# Patient Record
Sex: Male | Born: 1955 | Race: White | Hispanic: No | Marital: Single | Smoking: Current every day smoker
Health system: Southern US, Community
[De-identification: ages and names within clinical notes are randomized; demographics above are authoritative.]

## PROBLEM LIST (undated history)

## (undated) DIAGNOSIS — Z8673 Personal history of transient ischemic attack (TIA), and cerebral infarction without residual deficits: Secondary | ICD-10-CM

## (undated) DIAGNOSIS — Z87898 Personal history of other specified conditions: Secondary | ICD-10-CM

## (undated) DIAGNOSIS — K5790 Diverticulosis of intestine, part unspecified, without perforation or abscess without bleeding: Secondary | ICD-10-CM

## (undated) DIAGNOSIS — D369 Benign neoplasm, unspecified site: Secondary | ICD-10-CM

## (undated) DIAGNOSIS — F419 Anxiety disorder, unspecified: Secondary | ICD-10-CM

## (undated) DIAGNOSIS — F32A Depression, unspecified: Secondary | ICD-10-CM

## (undated) DIAGNOSIS — I10 Essential (primary) hypertension: Secondary | ICD-10-CM

## (undated) DIAGNOSIS — D696 Thrombocytopenia, unspecified: Secondary | ICD-10-CM

## (undated) DIAGNOSIS — K649 Unspecified hemorrhoids: Secondary | ICD-10-CM

## (undated) DIAGNOSIS — K449 Diaphragmatic hernia without obstruction or gangrene: Secondary | ICD-10-CM

## (undated) DIAGNOSIS — F1010 Alcohol abuse, uncomplicated: Secondary | ICD-10-CM

## (undated) DIAGNOSIS — F329 Major depressive disorder, single episode, unspecified: Secondary | ICD-10-CM

## (undated) HISTORY — DX: Thrombocytopenia, unspecified: D69.6

## (undated) HISTORY — DX: Unspecified hemorrhoids: K64.9

## (undated) HISTORY — DX: Personal history of other specified conditions: Z87.898

## (undated) HISTORY — DX: Diverticulosis of intestine, part unspecified, without perforation or abscess without bleeding: K57.90

## (undated) HISTORY — DX: Alcohol abuse, uncomplicated: F10.10

## (undated) HISTORY — DX: Diaphragmatic hernia without obstruction or gangrene: K44.9

## (undated) HISTORY — DX: Anxiety disorder, unspecified: F41.9

## (undated) HISTORY — DX: Personal history of transient ischemic attack (TIA), and cerebral infarction without residual deficits: Z86.73

## (undated) HISTORY — DX: Benign neoplasm, unspecified site: D36.9

## (undated) HISTORY — DX: Essential (primary) hypertension: I10

---

## 2009-12-15 ENCOUNTER — Inpatient Hospital Stay (HOSPITAL_COMMUNITY): Admission: EM | Admit: 2009-12-15 | Discharge: 2009-12-16 | Payer: Self-pay | Admitting: Emergency Medicine

## 2009-12-15 ENCOUNTER — Ambulatory Visit: Payer: Self-pay | Admitting: Cardiology

## 2009-12-16 ENCOUNTER — Encounter (INDEPENDENT_AMBULATORY_CARE_PROVIDER_SITE_OTHER): Payer: Self-pay | Admitting: Internal Medicine

## 2010-08-19 LAB — COMPREHENSIVE METABOLIC PANEL
ALT: 48 U/L (ref 0–53)
AST: 42 U/L — ABNORMAL HIGH (ref 0–37)
Albumin: 3.5 g/dL (ref 3.5–5.2)
CO2: 24 mEq/L (ref 19–32)
Calcium: 8.2 mg/dL — ABNORMAL LOW (ref 8.4–10.5)
Chloride: 111 mEq/L (ref 96–112)
Sodium: 141 mEq/L (ref 135–145)
Total Bilirubin: 0.4 mg/dL (ref 0.3–1.2)

## 2010-08-19 LAB — CBC
Hemoglobin: 13.5 g/dL (ref 13.0–17.0)
MCHC: 34.9 g/dL (ref 30.0–36.0)
MCV: 92.7 fL (ref 78.0–100.0)
RBC: 4.16 MIL/uL — ABNORMAL LOW (ref 4.22–5.81)
RDW: 12.9 % (ref 11.5–15.5)
WBC: 5.3 10*3/uL (ref 4.0–10.5)

## 2010-08-19 LAB — CARDIAC PANEL(CRET KIN+CKTOT+MB+TROPI)
CK, MB: 2.2 ng/mL (ref 0.3–4.0)
Relative Index: 1.5 (ref 0.0–2.5)
Total CK: 158 U/L (ref 7–232)
Total CK: 200 U/L (ref 7–232)
Troponin I: 0.02 ng/mL (ref 0.00–0.06)
Troponin I: 0.03 ng/mL (ref 0.00–0.06)

## 2010-08-19 LAB — DIFFERENTIAL
Basophils Relative: 1 % (ref 0–1)
Eosinophils Relative: 3 % (ref 0–5)
Lymphocytes Relative: 43 % (ref 12–46)
Monocytes Absolute: 0.5 10*3/uL (ref 0.1–1.0)
Neutro Abs: 2.3 10*3/uL (ref 1.7–7.7)
Neutrophils Relative %: 44 % (ref 43–77)

## 2010-08-19 LAB — LIPID PANEL
Cholesterol: 108 mg/dL (ref 0–200)
Total CHOL/HDL Ratio: 4.7 RATIO
Triglycerides: 190 mg/dL — ABNORMAL HIGH (ref <150)
VLDL: 38 mg/dL (ref 0–40)

## 2010-08-19 LAB — TSH: TSH: 4.069 u[IU]/mL (ref 0.350–4.500)

## 2010-08-20 LAB — COMPREHENSIVE METABOLIC PANEL
ALT: 58 U/L — ABNORMAL HIGH (ref 0–53)
AST: 55 U/L — ABNORMAL HIGH (ref 0–37)
Albumin: 4.2 g/dL (ref 3.5–5.2)
Alkaline Phosphatase: 66 U/L (ref 39–117)
BUN: 7 mg/dL (ref 6–23)
CO2: 23 mEq/L (ref 19–32)
Calcium: 8.5 mg/dL (ref 8.4–10.5)
Chloride: 113 mEq/L — ABNORMAL HIGH (ref 96–112)
Creatinine, Ser: 0.8 mg/dL (ref 0.4–1.5)
GFR calc Af Amer: >60 mL/min (ref >60)
GFR calc non Af Amer: >60 mL/min (ref >60)
Glucose, Bld: 116 mg/dL — ABNORMAL HIGH (ref 70–99)
Potassium: 4.1 mEq/L (ref 3.5–5.1)
Sodium: 144 mEq/L (ref 135–145)
Total Bilirubin: 0.5 mg/dL (ref 0.3–1.2)
Total Protein: 8.2 g/dL (ref 6.0–8.3)

## 2010-08-20 LAB — CBC
HCT: 44.6 % (ref 39.0–52.0)
Hemoglobin: 15.8 g/dL (ref 13.0–17.0)
MCH: 32.4 pg (ref 26.0–34.0)
MCHC: 35.3 g/dL (ref 30.0–36.0)
MCV: 91.8 fL (ref 78.0–100.0)
Platelets: 122 10*3/uL — ABNORMAL LOW (ref 150–400)
RBC: 4.86 MIL/uL (ref 4.22–5.81)
RDW: 12.9 % (ref 11.5–15.5)
WBC: 5.8 10*3/uL (ref 4.0–10.5)

## 2010-08-20 LAB — POCT CARDIAC MARKERS
CKMB, poc: 1.9 ng/mL (ref 1.0–8.0)
CKMB, poc: 2.4 ng/mL (ref 1.0–8.0)
CKMB, poc: 2.5 ng/mL (ref 1.0–8.0)
Myoglobin, poc: 209 ng/mL (ref 12–200)
Myoglobin, poc: 238 ng/mL (ref 12–200)
Troponin i, poc: <0.05 ng/mL (ref 0.00–0.09)
Troponin i, poc: <0.05 ng/mL (ref 0.00–0.09)

## 2010-08-20 LAB — PROTIME-INR
INR: 1.1 (ref 0.00–1.49)
Prothrombin Time: 14.1 seconds (ref 11.6–15.2)

## 2010-08-20 LAB — DIFFERENTIAL
Basophils Absolute: 0 10*3/uL (ref 0.0–0.1)
Basophils Relative: 1 % (ref 0–1)
Eosinophils Absolute: 0.1 10*3/uL (ref 0.0–0.7)
Eosinophils Relative: 2 % (ref 0–5)
Lymphocytes Relative: 38 % (ref 12–46)
Lymphs Abs: 2.2 10*3/uL (ref 0.7–4.0)
Monocytes Absolute: 0.4 10*3/uL (ref 0.1–1.0)
Monocytes Relative: 7 % (ref 3–12)
Neutro Abs: 3 10*3/uL (ref 1.7–7.7)
Neutrophils Relative %: 52 % (ref 43–77)

## 2010-08-20 LAB — ETHANOL: Alcohol, Ethyl (B): 284 mg/dL — ABNORMAL HIGH (ref 0–10)

## 2011-06-28 ENCOUNTER — Encounter: Payer: Self-pay | Admitting: Cardiology

## 2011-06-28 DIAGNOSIS — R079 Chest pain, unspecified: Secondary | ICD-10-CM | POA: Insufficient documentation

## 2011-06-28 DIAGNOSIS — F1010 Alcohol abuse, uncomplicated: Secondary | ICD-10-CM | POA: Insufficient documentation

## 2011-06-28 DIAGNOSIS — I10 Essential (primary) hypertension: Secondary | ICD-10-CM | POA: Insufficient documentation

## 2011-06-28 DIAGNOSIS — Z720 Tobacco use: Secondary | ICD-10-CM | POA: Insufficient documentation

## 2011-06-28 DIAGNOSIS — D696 Thrombocytopenia, unspecified: Secondary | ICD-10-CM | POA: Insufficient documentation

## 2011-07-20 ENCOUNTER — Encounter: Payer: Self-pay | Admitting: Cardiology

## 2011-07-27 ENCOUNTER — Telehealth: Payer: Self-pay | Admitting: Cardiology

## 2011-07-27 NOTE — Telephone Encounter (Signed)
PT WAS ON NOS LIST BUT HAS NEVER ESTABLISHED WITH THE OFFICE/TMJ

## 2011-08-20 ENCOUNTER — Other Ambulatory Visit: Payer: Self-pay

## 2011-08-20 ENCOUNTER — Emergency Department (HOSPITAL_COMMUNITY): Payer: Self-pay

## 2011-08-20 ENCOUNTER — Inpatient Hospital Stay (HOSPITAL_COMMUNITY)
Admission: EM | Admit: 2011-08-20 | Discharge: 2011-08-22 | DRG: 066 | Disposition: A | Discharge status: Home / Self Care | Payer: MEDICAID | Attending: Internal Medicine | Admitting: Internal Medicine

## 2011-08-20 ENCOUNTER — Encounter (HOSPITAL_COMMUNITY): Payer: Self-pay | Admitting: Emergency Medicine

## 2011-08-20 DIAGNOSIS — F32A Depression, unspecified: Secondary | ICD-10-CM

## 2011-08-20 DIAGNOSIS — I10 Essential (primary) hypertension: Secondary | ICD-10-CM

## 2011-08-20 DIAGNOSIS — I6350 Cerebral infarction due to unspecified occlusion or stenosis of unspecified cerebral artery: Principal | ICD-10-CM | POA: Diagnosis present

## 2011-08-20 DIAGNOSIS — F329 Major depressive disorder, single episode, unspecified: Secondary | ICD-10-CM | POA: Diagnosis present

## 2011-08-20 DIAGNOSIS — D696 Thrombocytopenia, unspecified: Secondary | ICD-10-CM

## 2011-08-20 DIAGNOSIS — F1010 Alcohol abuse, uncomplicated: Secondary | ICD-10-CM

## 2011-08-20 DIAGNOSIS — Z720 Tobacco use: Secondary | ICD-10-CM

## 2011-08-20 DIAGNOSIS — I639 Cerebral infarction, unspecified: Secondary | ICD-10-CM | POA: Diagnosis present

## 2011-08-20 DIAGNOSIS — F17200 Nicotine dependence, unspecified, uncomplicated: Secondary | ICD-10-CM | POA: Diagnosis present

## 2011-08-20 DIAGNOSIS — F3289 Other specified depressive episodes: Secondary | ICD-10-CM | POA: Diagnosis present

## 2011-08-20 DIAGNOSIS — F339 Major depressive disorder, recurrent, unspecified: Secondary | ICD-10-CM | POA: Diagnosis present

## 2011-08-20 DIAGNOSIS — R079 Chest pain, unspecified: Secondary | ICD-10-CM | POA: Diagnosis present

## 2011-08-20 LAB — URINALYSIS, ROUTINE W REFLEX MICROSCOPIC
Glucose, UA: NEGATIVE mg/dL
Leukocytes, UA: NEGATIVE
Specific Gravity, Urine: <1.005 — ABNORMAL LOW (ref 1.005–1.030)
pH: 5.5 (ref 5.0–8.0)

## 2011-08-20 LAB — COMPREHENSIVE METABOLIC PANEL
ALT: 109 U/L — ABNORMAL HIGH (ref 0–53)
AST: 97 U/L — ABNORMAL HIGH (ref 0–37)
Alkaline Phosphatase: 84 U/L (ref 39–117)
CO2: 25 mEq/L (ref 19–32)
Chloride: 101 mEq/L (ref 96–112)
Creatinine, Ser: 0.7 mg/dL (ref 0.50–1.35)
GFR calc non Af Amer: >90 mL/min (ref >90)
Potassium: 3.7 mEq/L (ref 3.5–5.1)
Sodium: 138 mEq/L (ref 135–145)
Total Bilirubin: 0.7 mg/dL (ref 0.3–1.2)

## 2011-08-20 LAB — URINE MICROSCOPIC-ADD ON

## 2011-08-20 LAB — CARDIAC PANEL(CRET KIN+CKTOT+MB+TROPI)
CK, MB: 1.4 ng/mL (ref 0.3–4.0)
Total CK: 55 U/L (ref 7–232)

## 2011-08-20 LAB — CBC
HCT: 46.2 % (ref 39.0–52.0)
RDW: 13.6 % (ref 11.5–15.5)
WBC: 7.6 10*3/uL (ref 4.0–10.5)

## 2011-08-20 LAB — DIFFERENTIAL
Basophils Absolute: 0 10*3/uL (ref 0.0–0.1)
Lymphocytes Relative: 15 % (ref 12–46)
Monocytes Absolute: 0.6 10*3/uL (ref 0.1–1.0)
Neutro Abs: 5.9 10*3/uL (ref 1.7–7.7)

## 2011-08-20 MED ORDER — ALPRAZOLAM 0.25 MG PO TABS
0.2500 mg | ORAL_TABLET | Freq: Three times a day (TID) | ORAL | Status: DC
  Administered 2011-08-20 – 2011-08-22 (×5): 0.25 mg via ORAL
  Filled 2011-08-20 (×5): qty 1

## 2011-08-20 MED ORDER — ACETAMINOPHEN 650 MG RE SUPP: 650.0000 mg | RECTAL | Status: DC | PRN

## 2011-08-20 MED ORDER — CITALOPRAM HYDROBROMIDE 20 MG PO TABS
10.0000 mg | ORAL_TABLET | Freq: Every day | ORAL | Status: DC
  Administered 2011-08-21 – 2011-08-22 (×2): 10 mg via ORAL
  Filled 2011-08-20 (×2): qty 1

## 2011-08-20 MED ORDER — ACETAMINOPHEN 325 MG PO TABS: 650.0000 mg | ORAL_TABLET | ORAL | Status: DC | PRN

## 2011-08-20 MED ORDER — ATENOLOL 25 MG PO TABS
25.0000 mg | ORAL_TABLET | Freq: Every day | ORAL | Status: DC
  Administered 2011-08-20 – 2011-08-22 (×3): 25 mg via ORAL
  Filled 2011-08-20 (×3): qty 1

## 2011-08-20 MED ORDER — SERTRALINE HCL 50 MG PO TABS: 50.0000 mg | ORAL_TABLET | Freq: Every day | ORAL | Status: DC

## 2011-08-20 MED ORDER — AMLODIPINE BESYLATE 5 MG PO TABS
10.0000 mg | ORAL_TABLET | Freq: Every day | ORAL | Status: DC
  Administered 2011-08-21 – 2011-08-22 (×2): 10 mg via ORAL
  Filled 2011-08-20 (×2): qty 2

## 2011-08-20 MED ORDER — ONDANSETRON HCL 4 MG/2ML IJ SOLN: 4.0000 mg | Freq: Four times a day (QID) | INTRAMUSCULAR | Status: DC | PRN

## 2011-08-20 MED ORDER — ENOXAPARIN SODIUM 40 MG/0.4ML ~~LOC~~ SOLN
40.0000 mg | SUBCUTANEOUS | Status: DC
  Administered 2011-08-20 – 2011-08-21 (×2): 40 mg via SUBCUTANEOUS
  Filled 2011-08-20 (×2): qty 0.4

## 2011-08-20 MED ORDER — ONDANSETRON HCL 4 MG/2ML IJ SOLN: 4.0000 mg | Freq: Three times a day (TID) | INTRAMUSCULAR | Status: DC | PRN

## 2011-08-20 MED ORDER — NICOTINE 7 MG/24HR TD PT24
7.0000 mg | MEDICATED_PATCH | Freq: Every day | TRANSDERMAL | Status: DC
  Administered 2011-08-20 – 2011-08-22 (×3): 7 mg via TRANSDERMAL
  Filled 2011-08-20 (×5): qty 1

## 2011-08-20 MED ORDER — ASPIRIN 300 MG RE SUPP
300.0000 mg | Freq: Every day | RECTAL | Status: DC
  Filled 2011-08-20 (×3): qty 1

## 2011-08-20 MED ORDER — NICOTINE 7 MG/24HR TD PT24
MEDICATED_PATCH | TRANSDERMAL | Status: AC
  Filled 2011-08-20: qty 1

## 2011-08-20 MED ORDER — ASPIRIN 325 MG PO TABS
325.0000 mg | ORAL_TABLET | Freq: Every day | ORAL | Status: DC
  Administered 2011-08-20 – 2011-08-22 (×3): 325 mg via ORAL
  Filled 2011-08-20 (×3): qty 1

## 2011-08-20 MED ORDER — SENNOSIDES-DOCUSATE SODIUM 8.6-50 MG PO TABS: 1.0000 | ORAL_TABLET | Freq: Every evening | ORAL | Status: DC | PRN

## 2011-08-20 NOTE — H&P (Signed)
PCP:   Dwana Melena, MD, MD   Chief Complaint:  Right arm numbness  HPI: This is a 55 year old gentleman with past medical history of hypertension, tobacco abuse, depression, alcohol abuse. He reports the emergency room today when he woke up at around 2 AM this morning with complaints of right arm numbness. His symptoms progressed to involve his right leg, right side of his neck and face. He denies any weakness in the area, no changes in his vision, no difficulty with speech or swallowing. He has had intermittent chest pain for quite some time now. Not related specifically to exertion, often occurring at rest. He says he was supposed to be evaluated by cardiology as an outpatient but did not show for his outpatient appointment. Denies any fever, cough, nausea, vomiting, abdominal complaints. He still actively smoking. He reports drinking approximately twice a week, but admits to drinking heavily. He reports that in the past when he has stopped drinking he has had tremors. His last drink was 2 days ago. Patient was evaluated in the emergency room where MRI of the brain showed an acute infarct. He has been referred for admission.  Allergies:  Not on File    Past Medical History  Diagnosis Date  . Chest pain 2007    Associated with syncope in 2011  . Hypertension     Echo in 2011-mild LVH, hyperdynamic LV function, mild to moderate AI  . Alcohol abuse   . Tobacco abuse     20 pack years; 1/2 pack per day  . Thrombocytopenia     possible alcohol toxicity; 122,000  . Chronic sinusitis     By CT    History reviewed. No pertinent past surgical history.  Prior to Admission medications   Medication Sig Start Date End Date Taking? Authorizing Provider  ALPRAZolam (XANAX) 0.25 MG tablet Take 0.25 mg by mouth every 8 (eight) hours.   Yes Historical Provider, MD  amLODipine (NORVASC) 10 MG tablet Take 10 mg by mouth daily.   Yes Historical Provider, MD  atenolol (TENORMIN) 25 MG tablet Take 25 mg  by mouth daily.   Yes Historical Provider, MD  citalopram (CELEXA) 10 MG tablet Take 10 mg by mouth daily.   Yes Historical Provider, MD  sertraline (ZOLOFT) 50 MG tablet Take 50 mg by mouth daily. Prescription never filled   Yes Historical Provider, MD    Social History:  reports that he has been smoking.  He has never used smokeless tobacco. He reports that he drinks about 15 ounces of alcohol per week. He reports that he does not use illicit drugs.  Family History  Problem Relation Age of Onset  . Hypertension    . Alcohol abuse Neg Hx   . Coronary artery disease Mother 42    CABG  . Coronary artery disease Father 70    CABG    Review of Systems: Positives in bold Constitutional: Denies fever, chills, diaphoresis, appetite change and fatigue.  HEENT: Denies photophobia, eye pain, redness, hearing loss, ear pain, congestion, sore throat, rhinorrhea, sneezing, mouth sores, trouble swallowing, neck pain, neck stiffness and tinnitus.   Respiratory: Denies SOB, DOE, cough, chest tightness,  and wheezing.   Cardiovascular: Denies chest pain, palpitations and leg swelling.  Gastrointestinal: Denies nausea, vomiting, abdominal pain, diarrhea, constipation, blood in stool (hemorrhoidal, chronic) and abdominal distention.  Genitourinary: Denies dysuria, urgency, frequency, hematuria, flank pain and difficulty urinating.  Musculoskeletal: Denies myalgias, back pain, joint swelling, arthralgias and gait problem.  Skin: Denies pallor,  rash and wound.  Neurological: Denies dizziness, seizures, syncope, weakness, light-headedness, numbness and headaches.  Hematological: Denies adenopathy. Easy bruising, personal or family bleeding history  Psychiatric/Behavioral: Denies suicidal ideation, mood changes, confusion, nervousness, sleep disturbance and agitation   Physical Exam: Blood pressure 173/92, pulse 73, temperature 98.4 F (36.9 C), temperature source Oral, resp. rate 18, height 5\' 7"  (1.702  m), weight 70.308 kg (155 lb), SpO2 95.00%. General: Patient is lying in bed, in no acute distress HEENT: Normocephalic, atraumatic, extraocular motions are intact Neck: Supple Chest: Clear to auscultation bilaterally Cardiac: S1, S2, regular rate and rhythm Abdomen: Soft, nontender, nondistended, bowel sounds are active Extremities: No cyanosis, clubbing, edema Neurologic: 5 out of 5 strength bilaterally in the upper or lower extremities, cranial nerves appear to be intact Skin: Intact, no visible rashes  Labs on Admission:  Results for orders placed during the hospital encounter of 08/20/11 (from the past 48 hour(s))  CBC     Status: Abnormal   Collection Time   08/20/11 12:49 PM      Component Value Range Comment   WBC 7.6  4.0 - 10.5 (K/uL)    RBC 5.05  4.22 - 5.81 (MIL/uL)    Hemoglobin 16.4  13.0 - 17.0 (g/dL)    HCT 16.1  09.6 - 04.5 (%)    MCV 91.5  78.0 - 100.0 (fL)    MCH 32.5  26.0 - 34.0 (pg)    MCHC 35.5  30.0 - 36.0 (g/dL)    RDW 40.9  81.1 - 91.4 (%)    Platelets 140 (*) 150 - 400 (K/uL)   DIFFERENTIAL     Status: Normal   Collection Time   08/20/11 12:49 PM      Component Value Range Comment   Neutrophils Relative 77  43 - 77 (%)    Neutro Abs 5.9  1.7 - 7.7 (K/uL)    Lymphocytes Relative 15  12 - 46 (%)    Lymphs Abs 1.1  0.7 - 4.0 (K/uL)    Monocytes Relative 8  3 - 12 (%)    Monocytes Absolute 0.6  0.1 - 1.0 (K/uL)    Eosinophils Relative 0  0 - 5 (%)    Eosinophils Absolute 0.0  0.0 - 0.7 (K/uL)    Basophils Relative 1  0 - 1 (%)    Basophils Absolute 0.0  0.0 - 0.1 (K/uL)   COMPREHENSIVE METABOLIC PANEL     Status: Abnormal   Collection Time   08/20/11 12:49 PM      Component Value Range Comment   Sodium 138  135 - 145 (mEq/L)    Potassium 3.7  3.5 - 5.1 (mEq/L)    Chloride 101  96 - 112 (mEq/L)    CO2 25  19 - 32 (mEq/L)    Glucose, Bld 115 (*) 70 - 99 (mg/dL)    BUN 11  6 - 23 (mg/dL)    Creatinine, Ser 7.82  0.50 - 1.35 (mg/dL)    Calcium 9.3   8.4 - 10.5 (mg/dL)    Total Protein 8.2  6.0 - 8.3 (g/dL)    Albumin 4.0  3.5 - 5.2 (g/dL)    AST 97 (*) 0 - 37 (U/L)    ALT 109 (*) 0 - 53 (U/L)    Alkaline Phosphatase 84  39 - 117 (U/L)    Total Bilirubin 0.7  0.3 - 1.2 (mg/dL)    GFR calc non Af Amer >90  >90 (mL/min)    GFR  calc Af Amer >90  >90 (mL/min)   CARDIAC PANEL(CRET KIN+CKTOT+MB+TROPI)     Status: Normal   Collection Time   08/20/11 12:49 PM      Component Value Range Comment   Total CK 59  7 - 232 (U/L)    CK, MB 1.4  0.3 - 4.0 (ng/mL)    Troponin I <0.30  <0.30 (ng/mL)    Relative Index RELATIVE INDEX IS INVALID  0.0 - 2.5    URINALYSIS, ROUTINE W REFLEX MICROSCOPIC     Status: Abnormal   Collection Time   08/20/11  1:48 PM      Component Value Range Comment   Color, Urine YELLOW  YELLOW     APPearance CLEAR  CLEAR     Specific Gravity, Urine <1.005 (*) 1.005 - 1.030     pH 5.5  5.0 - 8.0     Glucose, UA NEGATIVE  NEGATIVE (mg/dL)    Hgb urine dipstick MODERATE (*) NEGATIVE     Bilirubin Urine NEGATIVE  NEGATIVE     Ketones, ur NEGATIVE  NEGATIVE (mg/dL)    Protein, ur TRACE (*) NEGATIVE (mg/dL)    Urobilinogen, UA 0.2  0.0 - 1.0 (mg/dL)    Nitrite NEGATIVE  NEGATIVE     Leukocytes, UA NEGATIVE  NEGATIVE    URINE MICROSCOPIC-ADD ON     Status: Abnormal   Collection Time   08/20/11  1:48 PM      Component Value Range Comment   Squamous Epithelial / LPF FEW (*) RARE     WBC, UA 0-2  <3 (WBC/hpf)    RBC / HPF 3-6  <3 (RBC/hpf)    Bacteria, UA RARE  RARE     Casts HYALINE CASTS (*) NEGATIVE      Radiological Exams on Admission: Dg Chest 2 View  08/20/2011  *RADIOLOGY REPORT*  Clinical Data: Chest pain, right side numbness, history hypertension, smoking  CHEST - 2 VIEW  Comparison: 12/15/2009  Findings: Normal heart size, mediastinal contours, and pulmonary vascularity. Atherosclerotic calcification aortic arch. Chronic bronchitic changes. No pulmonary infiltrate, pleural effusion or pneumothorax. No acute  osseous findings.  IMPRESSION: Chronic bronchitic changes.  Original Report Authenticated By: Lollie Marrow, M.D.   Ct Head Wo Contrast  08/20/2011  *RADIOLOGY REPORT*  Clinical Data: Dizziness with standing.  Numbness to right side of face and arm.  Hypertension.  CT HEAD WITHOUT CONTRAST  Technique:  Contiguous axial images were obtained from the base of the skull through the vertex without contrast.  Comparison: 12/15/2009  Findings: Bone windows demonstrate clear paranasal sinuses and mastoid air cells.  Soft tissue windows demonstrate low density in the left thalamus is most consistent with remote lacunar infarct, but is new since 12/15/2009.  Image 15. No  mass lesion, hemorrhage, hydrocephalus, acute infarct, intra-axial, or extra-axial fluid collection.  IMPRESSION:  1.  No acute intracranial abnormality. 2.    Remote left thalamic caudate lacunar infarct.  New since 12/15/2009.  Original Report Authenticated By: Consuello Bossier, M.D.   Mr Brain Wo Contrast  08/20/2011  *RADIOLOGY REPORT*  Clinical Data: Dizziness.  Right-sided numbness.  MRI HEAD WITHOUT CONTRAST  Technique:  Multiplanar, multiecho pulse sequences of the brain and surrounding structures were obtained according to standard protocol without intravenous contrast.  Comparison: Head CT same day  Findings: Diffusion imaging shows a 3 mm acute infarction within the lower left dorsal pons, possibly with minor involvement of the medulla as well.  No evidence of swelling or  hemorrhage.  Elsewhere, the brain shows old lacunar infarctions in the left thalamus and mild chronic small vessel change within the hemispheric white matter.  No cortical or large vessel territory infarction.  No mass lesion, hemorrhage, hydrocephalus or extra- axial collection.  No pituitary mass.  No inflammatory sinus disease.  No skull or skull base lesion.  IMPRESSION: 3 mm focus of acute infarction within the left dorsal pons. Question minor involvement in the upper  medulla as well.  No evidence of swelling or hemorrhage.  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  Minor chronic small vessel change within the hemispheric white matter.  Original Report Authenticated By: Thomasenia Sales, M.D.    Assessment/Plan Principal Problem:  *Stroke Active Problems:  Chest pain  Hypertension  Alcohol abuse  Tobacco abuse  Depression  Plan:  Patient will be admitted to the hospital to telemetry bed for further workup of his stroke. He reports taking aspirin infrequently. We will continue him on aspirin for now and asked for neurology consultation. He will undergo 2-D echocardiogram and carotid Dopplers as well as MRA of the head. Further orders per stroke orders set  Patient has had intermittent chest pain, we will cycle his cardiac markers and obtain a 2-D echocardiogram. If all these are negative then he can likely follow up with cardiology as an outpatient for stress testing.  Patient was extensively counseled on the importance of tobacco cessation. We will provide him with a nicotine patch.  We will watch for any signs of alcohol withdrawal at this time.  We will continue his outpatient blood pressure medication and adjust accordingly  Further orders per the clinical course  Time Spent on Admission:  Tomoya Ringwald [facility] Pager: [phone] 08/20/2011, 6:23 PM

## 2011-08-20 NOTE — ED Notes (Signed)
Attempted to call report and was unsuccessful.

## 2011-08-20 NOTE — ED Notes (Signed)
Pt c/o rt sided numbness and tingling since 230am.

## 2011-08-20 NOTE — ED Provider Notes (Signed)
History   This chart was scribed for Dione Booze, MD by Clarita Crane. The patient was seen in room [facility]18[facility]. Patient's care was started at 1200.    CSN: [id number]  Arrival date & time 08/20/11  1200   First MD Initiated Contact with Patient 08/20/11 1223      Chief Complaint  Patient presents with  . Numbness    (Consider location/radiation/quality/duration/timing/severity/associated sxs/prior treatment) HPI Jesse Stafford is a 56 y.o. male who presents to the Emergency Department complaining of constant moderate to severe numbness to right side of face, right upper extremity and right lower extremity onset 10.5 hours ago and persistent since with associated dizziness with standing. Denies weakness, slurred speech, aphasia, chest pain, abdominal pain, nausea, vomiting, HA  and previous history of similar symptoms. Patient with h/o HTN, thrombocytopenia, chronic sinusitis and is right hand dominant.   PCP- Margo Aye  Past Medical History  Diagnosis Date  . Chest pain 2007    Associated with syncope in 2011  . Hypertension     Echo in 2011-mild LVH, hyperdynamic LV function, mild to moderate AI  . Alcohol abuse   . Tobacco abuse     20 pack years; 1/2 pack per day  . Thrombocytopenia     possible alcohol toxicity; 122,000  . Chronic sinusitis     By CT    History reviewed. No pertinent past surgical history.  Family History  Problem Relation Age of Onset  . Hypertension    . Alcohol abuse Neg Hx   . Coronary artery disease Mother 82    CABG  . Coronary artery disease Father 43    CABG    History  Substance Use Topics  . Smoking status: Current Everyday Smoker -- 1.0 packs/day for 40 years  . Smokeless tobacco: Never Used  . Alcohol Use: 15.0 oz/week    30 drink(s) per week     Multiple hospital admissions with chest pain and inebriation      Review of Systems  Constitutional: Negative for fever, chills and diaphoresis.  HENT: Negative for neck pain.     Respiratory: Negative for shortness of breath.   Cardiovascular: Negative for chest pain.  Gastrointestinal: Negative for nausea, vomiting, abdominal pain and diarrhea.  Neurological: Positive for dizziness and numbness.  All other systems reviewed and are negative.    Allergies  Review of patient's allergies indicates no known allergies.  Home Medications  No current outpatient prescriptions on file.  BP 173/92  Pulse 73  Temp(Src) 98.4 F (36.9 C) (Oral)  Resp 18  Ht 5\' 7"  (1.702 m)  Wt 155 lb (70.308 kg)  BMI 24.28 kg/m2  SpO2 95%  Physical Exam  Nursing note and vitals reviewed. Constitutional: He is oriented to person, place, and time. He appears well-developed and well-nourished. No distress.  HENT:  Head: Normocephalic and atraumatic.  Eyes: EOM are normal. Pupils are equal, round, and reactive to light.       Fundi normal bilaterally.   Neck: Neck supple. No tracheal deviation present.       No carotid bruit.   Cardiovascular: Normal rate and regular rhythm.  Exam reveals no gallop and no friction rub.   No murmur heard. Pulmonary/Chest: Effort normal. No respiratory distress. He has no wheezes.  Abdominal: Soft. Bowel sounds are normal. He exhibits no distension.  Musculoskeletal: Normal range of motion. He exhibits no edema.  Neurological: He is alert and oriented to person, place, and time. No cranial nerve deficit or  sensory deficit.       No pronator drift. Sensation intact and equal to bilateral sides of face, bilateral upper extremities and bilateral lower extremities. Mild weakness to right lower extremity, rated 4+/5.   Skin: Skin is warm and dry.  Psychiatric: He has a normal mood and affect. His behavior is normal.    ED Course  Procedures (including critical care time)  DIAGNOSTIC STUDIES: Oxygen Saturation is 95% on room air, adequate by my interpretation.    COORDINATION OF CARE: 12:36PM- Patient informed of current plan for treatment and  evaluation and agrees with plan at this time.     Results for orders placed during the hospital encounter of 08/20/11  CBC      Component Value Range   WBC 7.6  4.0 - 10.5 (K/uL)   RBC 5.05  4.22 - 5.81 (MIL/uL)   Hemoglobin 16.4  13.0 - 17.0 (g/dL)   HCT 40.9  81.1 - 91.4 (%)   MCV 91.5  78.0 - 100.0 (fL)   MCH 32.5  26.0 - 34.0 (pg)   MCHC 35.5  30.0 - 36.0 (g/dL)   RDW 78.2  95.6 - 21.3 (%)   Platelets 140 (*) 150 - 400 (K/uL)  DIFFERENTIAL      Component Value Range   Neutrophils Relative 77  43 - 77 (%)   Neutro Abs 5.9  1.7 - 7.7 (K/uL)   Lymphocytes Relative 15  12 - 46 (%)   Lymphs Abs 1.1  0.7 - 4.0 (K/uL)   Monocytes Relative 8  3 - 12 (%)   Monocytes Absolute 0.6  0.1 - 1.0 (K/uL)   Eosinophils Relative 0  0 - 5 (%)   Eosinophils Absolute 0.0  0.0 - 0.7 (K/uL)   Basophils Relative 1  0 - 1 (%)   Basophils Absolute 0.0  0.0 - 0.1 (K/uL)  COMPREHENSIVE METABOLIC PANEL      Component Value Range   Sodium 138  135 - 145 (mEq/L)   Potassium 3.7  3.5 - 5.1 (mEq/L)   Chloride 101  96 - 112 (mEq/L)   CO2 25  19 - 32 (mEq/L)   Glucose, Bld 115 (*) 70 - 99 (mg/dL)   BUN 11  6 - 23 (mg/dL)   Creatinine, Ser 0.86  0.50 - 1.35 (mg/dL)   Calcium 9.3  8.4 - 57.8 (mg/dL)   Total Protein 8.2  6.0 - 8.3 (g/dL)   Albumin 4.0  3.5 - 5.2 (g/dL)   AST 97 (*) 0 - 37 (U/L)   ALT 109 (*) 0 - 53 (U/L)   Alkaline Phosphatase 84  39 - 117 (U/L)   Total Bilirubin 0.7  0.3 - 1.2 (mg/dL)   GFR calc non Af Amer >90  >90 (mL/min)   GFR calc Af Amer >90  >90 (mL/min)  CARDIAC PANEL(CRET KIN+CKTOT+MB+TROPI)      Component Value Range   Total CK 59  7 - 232 (U/L)   CK, MB 1.4  0.3 - 4.0 (ng/mL)   Troponin I <0.30  <0.30 (ng/mL)   Relative Index RELATIVE INDEX IS INVALID  0.0 - 2.5   URINALYSIS, ROUTINE W REFLEX MICROSCOPIC      Component Value Range   Color, Urine YELLOW  YELLOW    APPearance CLEAR  CLEAR    Specific Gravity, Urine <1.005 (*) 1.005 - 1.030    pH 5.5  5.0 - 8.0     Glucose, UA NEGATIVE  NEGATIVE (mg/dL)   Hgb urine dipstick  MODERATE (*) NEGATIVE    Bilirubin Urine NEGATIVE  NEGATIVE    Ketones, ur NEGATIVE  NEGATIVE (mg/dL)   Protein, ur TRACE (*) NEGATIVE (mg/dL)   Urobilinogen, UA 0.2  0.0 - 1.0 (mg/dL)   Nitrite NEGATIVE  NEGATIVE    Leukocytes, UA NEGATIVE  NEGATIVE   URINE MICROSCOPIC-ADD ON      Component Value Range   Squamous Epithelial / LPF FEW (*) RARE    WBC, UA 0-2  <3 (WBC/hpf)   RBC / HPF 3-6  <3 (RBC/hpf)   Bacteria, UA RARE  RARE    Casts HYALINE CASTS (*) NEGATIVE    Dg Chest 2 View  08/20/2011  *RADIOLOGY REPORT*  Clinical Data: Chest pain, right side numbness, history hypertension, smoking  CHEST - 2 VIEW  Comparison: 12/15/2009  Findings: Normal heart size, mediastinal contours, and pulmonary vascularity. Atherosclerotic calcification aortic arch. Chronic bronchitic changes. No pulmonary infiltrate, pleural effusion or pneumothorax. No acute osseous findings.  IMPRESSION: Chronic bronchitic changes.  Original Report Authenticated By: Lollie Marrow, M.D.   Ct Head Wo Contrast  08/20/2011  *RADIOLOGY REPORT*  Clinical Data: Dizziness with standing.  Numbness to right side of face and arm.  Hypertension.  CT HEAD WITHOUT CONTRAST  Technique:  Contiguous axial images were obtained from the base of the skull through the vertex without contrast.  Comparison: 12/15/2009  Findings: Bone windows demonstrate clear paranasal sinuses and mastoid air cells.  Soft tissue windows demonstrate low density in the left thalamus is most consistent with remote lacunar infarct, but is new since 12/15/2009.  Image 15. No  mass lesion, hemorrhage, hydrocephalus, acute infarct, intra-axial, or extra-axial fluid collection.  IMPRESSION:  1.  No acute intracranial abnormality. 2.    Remote left thalamic caudate lacunar infarct.  New since 12/15/2009.  Original Report Authenticated By: Consuello Bossier, M.D.   Mr Brain Wo Contrast  08/20/2011  *RADIOLOGY REPORT*   Clinical Data: Dizziness.  Right-sided numbness.  MRI HEAD WITHOUT CONTRAST  Technique:  Multiplanar, multiecho pulse sequences of the brain and surrounding structures were obtained according to standard protocol without intravenous contrast.  Comparison: Head CT same day  Findings: Diffusion imaging shows a 3 mm acute infarction within the lower left dorsal pons, possibly with minor involvement of the medulla as well.  No evidence of swelling or hemorrhage.  Elsewhere, the brain shows old lacunar infarctions in the left thalamus and mild chronic small vessel change within the hemispheric white matter.  No cortical or large vessel territory infarction.  No mass lesion, hemorrhage, hydrocephalus or extra- axial collection.  No pituitary mass.  No inflammatory sinus disease.  No skull or skull base lesion.  IMPRESSION: 3 mm focus of acute infarction within the left dorsal pons. Question minor involvement in the upper medulla as well.  No evidence of swelling or hemorrhage.  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  Minor chronic small vessel change within the hemispheric white matter.  Original Report Authenticated By: Thomasenia Sales, M.D.     Date: 08/20/2011  Rate: 72  Rhythm: normal sinus rhythm  QRS Axis: normal  Intervals: normal  ST/T Wave abnormalities: normal  Conduction Disutrbances:none  Narrative Interpretation: Q waves in leads V1 and V2 consistent with an old anteroseptal myocardial infarction. When compared with ECG of 12/15/2009, no significant changes are seen.  Old EKG Reviewed: unchanged   CT scan was unremarkable, but MRI confirmed a small stroke. Blood pressure continues to be moderately elevated. He does not  qualify for any of the interventional modalities for acute stroke because of mildness of deficit and time since last known to be normal. He will need to be admitted for neurologic observation and evaluation for correctable causes to prevent future strokes. Case is  discussed with Dr. Kerry Hough who agrees that the patient requests that I write temporary admitting orders.  1. Stroke   2. Hypertension       MDM  Right-sided numbness which may represent a stroke. Is not a thrombolytic candidate because of time from last known normal and NIH Stroke Scale which would only be one. Time to last known normal being over 12 hours also does not qualify him for a code stroke status.      I personally performed the services described in this documentation, which was scribed in my presence. The recorded information has been reviewed and considered.      Dione Booze, MD 08/20/11 [phone]

## 2011-08-21 ENCOUNTER — Inpatient Hospital Stay (HOSPITAL_COMMUNITY): Payer: Self-pay

## 2011-08-21 DIAGNOSIS — I359 Nonrheumatic aortic valve disorder, unspecified: Secondary | ICD-10-CM

## 2011-08-21 LAB — CBC
HCT: 43.7 % (ref 39.0–52.0)
Hemoglobin: 15.3 g/dL (ref 13.0–17.0)
MCV: 92.4 fL (ref 78.0–100.0)
RBC: 4.73 MIL/uL (ref 4.22–5.81)
RDW: 13.6 % (ref 11.5–15.5)
WBC: 6.7 10*3/uL (ref 4.0–10.5)

## 2011-08-21 LAB — BASIC METABOLIC PANEL
BUN: 15 mg/dL (ref 6–23)
CO2: 30 mEq/L (ref 19–32)
Chloride: 100 mEq/L (ref 96–112)
Creatinine, Ser: 0.85 mg/dL (ref 0.50–1.35)
Glucose, Bld: 92 mg/dL (ref 70–99)
Potassium: 3.8 mEq/L (ref 3.5–5.1)

## 2011-08-21 LAB — CARDIAC PANEL(CRET KIN+CKTOT+MB+TROPI)
Relative Index: INVALID (ref 0.0–2.5)
Total CK: 51 U/L (ref 7–232)
Troponin I: <0.3 ng/mL (ref <0.30)

## 2011-08-21 LAB — LIPID PANEL
LDL Cholesterol: 93 mg/dL (ref 0–99)
Total CHOL/HDL Ratio: 4.4 RATIO

## 2011-08-21 LAB — HEMOGLOBIN A1C: Mean Plasma Glucose: 97 mg/dL (ref <117)

## 2011-08-21 MED ORDER — ATORVASTATIN CALCIUM 10 MG PO TABS
10.0000 mg | ORAL_TABLET | Freq: Every day | ORAL | Status: DC
  Administered 2011-08-21: 10 mg via ORAL
  Filled 2011-08-21: qty 1
  Filled 2011-08-21: qty 2

## 2011-08-21 NOTE — Progress Notes (Signed)
Speech Language/Pathology  Patient Details Name: MAHARI STRAHM MRN: [id number] DOB: [birth year] Today's Date: 08/21/2011  Pt was screened at bedside and he denies any changes in his memory, swallowing, speech, or communication. He has been consuming a regular diet without incident. His only complaint his numbness on the right side of his body. No further SLP services indicated at this time. Please reconsult prn.  Thank you, Havery Moros, CCC-SLP  Marx Doig 08/21/2011, 6:27 PM

## 2011-08-21 NOTE — Evaluation (Signed)
Physical Therapy Evaluation Patient Details Name: Jesse Stafford MRN: [id number] DOB: Apr 07, 1956 Today's Date: 08/21/2011  Problem List:  Patient Active Problem List  Diagnoses  . Chest pain  . Hypertension  . Alcohol abuse  . Tobacco abuse  . Thrombocytopenia  . Stroke  . Depression    Past Medical History:  Past Medical History  Diagnosis Date  . Chest pain 2007    Associated with syncope in 2011  . Hypertension     Echo in 2011-mild LVH, hyperdynamic LV function, mild to moderate AI  . Alcohol abuse   . Tobacco abuse     20 pack years; 1/2 pack per day  . Thrombocytopenia     possible alcohol toxicity; 122,000  . Chronic sinusitis     By CT  . Chest pain    Past Surgical History: History reviewed. No pertinent past surgical history.  PT Assessment/Plan/Recommendation PT Assessment Clinical Impression Statement: pt states that he has been under a great deal of stress since the death of his mother...only sx today is that of mild numbness R extremeties...functionally, he is independent with no abnormality PT Recommendation/Assessment: Patent does not need any further PT services No Skilled PT: Patient at baseline level of functioning;Patient is independent with all acitivity/mobility;All education completed PT Recommendation Follow Up Recommendations: No PT follow up Equipment Recommended: None recommended by PT PT Goals     PT Evaluation Precautions/Restrictions  Precautions Required Braces or Orthoses: No Restrictions Weight Bearing Restrictions: No Prior Functioning  Home Living Lives With: Alone Type of Home: Mobile home Home Layout: One level Home Access: Level entry Home Adaptive Equipment: Walker - rolling Prior Function Level of Independence: Independent with basic ADLs;Independent with homemaking with ambulation;Independent with gait;Independent with transfers Driving: No Vocation: Unemployed Cognition Cognition Arousal/Alertness:  Awake/alert Overall Cognitive Status: Appears within functional limits for tasks assessed Orientation Level: Oriented X4 Sensation/Coordination Sensation Light Touch: Appears Intact (pt describes mild numbness R UE/LE) Stereognosis: Not tested Hot/Cold: Not tested Proprioception: Appears Intact Coordination Gross Motor Movements are Fluid and Coordinated: Yes Fine Motor Movements are Fluid and Coordinated: Yes Finger Nose Finger Test: normal Heel Shin Test: normal Extremity Assessment RUE Assessment RUE Assessment: Within Functional Limits LUE Assessment LUE Assessment: Within Functional Limits RLE Assessment RLE Assessment: Within Functional Limits LLE Assessment LLE Assessment: Within Functional Limits Mobility (including Balance) Bed Mobility Bed Mobility: Yes Supine to Sit: 7: Independent Sitting - Scoot to Edge of Bed: 7: Independent Sit to Supine: 7: Independent Transfers Transfers: Yes Sit to Stand: 7: Independent Stand to Sit: 7: Independent Ambulation/Gait Ambulation/Gait: Yes Ambulation/Gait Assistance: 7: Independent Ambulation Distance (Feet): 300 Feet (on level and ramp) Assistive device: None Gait Pattern: Within Functional Limits Stairs: No Wheelchair Mobility Wheelchair Mobility: No  Posture/Postural Control Posture/Postural Control: No significant limitations High Level Balance High Level Balance Activites: Side stepping;Braiding;Backward walking;Direction changes;Turns;Head turns;Sudden stops High Level Balance Comments: no balance abnormality Exercise    End of Session PT - End of Session Equipment Utilized During Treatment: Gait belt Activity Tolerance: Patient tolerated treatment well Patient left: in chair Nurse Communication: Mobility status for transfers;Mobility status for ambulation General Behavior During Session: [facility] for tasks performed Cognition: [facility] for tasks performed  Konrad Penta 08/21/2011, 10:09 AM

## 2011-08-21 NOTE — Progress Notes (Signed)
CARE MANAGEMENT NOTE 08/21/2011  Patient:  Jesse Stafford, Jesse Stafford   Account Number:  [id number]  Date Initiated:  08/21/2011  Documentation initiated by:  Rosemary Holms  Subjective/Objective Assessment:   Pt admitted from home with R. sided weakness and CVA workup.     Action/Plan:   Spoke with pt at bedside, No needs anticipated other then affording any DC medications.   Anticipated DC Date:  08/23/2011   Anticipated DC Plan:  HOME/SELF CARE  In-house referral  Financial Counselor      DC Planning Services  CM consult      Choice offered to / List presented to:             Status of service:  In process, will continue to follow Medicare Important Message given?   (If response is "NO", the following Medicare IM given date fields will be blank) Date Medicare IM given:   Date Additional Medicare IM given:    Discharge Disposition:    Per UR Regulation:    If discussed at Long Length of Stay Meetings, dates discussed:    Comments:  08/21/11 1500 Mikailah Morel Leanord Hawking RN BSN CM

## 2011-08-21 NOTE — Progress Notes (Signed)
*  PRELIMINARY RESULTS* Echocardiogram 2D Echocardiogram has been performed.  Conrad [place] 08/21/2011, 10:39 AM

## 2011-08-21 NOTE — Progress Notes (Signed)
Subjective: Continues to have right arm and right leg numbness No decrease in strength  Objective: Vital signs in last 24 hours: Filed Vitals:   08/21/11 0032 08/21/11 0147 08/21/11 0430 08/21/11 1007  BP: 151/85 150/82 147/78 144/79  Pulse: 54 52 52 52  Temp: 98 F (36.7 C) 97.9 F (36.6 C) 98.1 F (36.7 C) 97.8 F (36.6 C)  TempSrc: Oral Oral Oral Oral  Resp: 18 18 18 17   Height:      Weight:      SpO2: 97% 95% 96% 95%   No intake or output data in the 24 hours ending 08/21/11 1332  Weight change:   General: Patient is lying in bed, in no acute distress  HEENT: Normocephalic, atraumatic, extraocular motions are intact  Neck: Supple  Chest: Clear to auscultation bilaterally  Cardiac: S1, S2, regular rate and rhythm  Abdomen: Soft, nontender, nondistended, bowel sounds are active  Extremities: No cyanosis, clubbing, edema  Neurologic: 5 out of 5 strength bilaterally in the upper or lower extremities, cranial nerves appear to be intact  Skin: Intact, no visible rashes   Lab Results: Results for orders placed during the hospital encounter of 08/20/11 (from the past 24 hour(s))  URINALYSIS, ROUTINE W REFLEX MICROSCOPIC     Status: Abnormal   Collection Time   08/20/11  1:48 PM      Component Value Range   Color, Urine YELLOW  YELLOW    APPearance CLEAR  CLEAR    Specific Gravity, Urine <1.005 (*) 1.005 - 1.030    pH 5.5  5.0 - 8.0    Glucose, UA NEGATIVE  NEGATIVE (mg/dL)   Hgb urine dipstick MODERATE (*) NEGATIVE    Bilirubin Urine NEGATIVE  NEGATIVE    Ketones, ur NEGATIVE  NEGATIVE (mg/dL)   Protein, ur TRACE (*) NEGATIVE (mg/dL)   Urobilinogen, UA 0.2  0.0 - 1.0 (mg/dL)   Nitrite NEGATIVE  NEGATIVE    Leukocytes, UA NEGATIVE  NEGATIVE   URINE MICROSCOPIC-ADD ON     Status: Abnormal   Collection Time   08/20/11  1:48 PM      Component Value Range   Squamous Epithelial / LPF FEW (*) RARE    WBC, UA 0-2  <3 (WBC/hpf)   RBC / HPF 3-6  <3 (RBC/hpf)   Bacteria,  UA RARE  RARE    Casts HYALINE CASTS (*) NEGATIVE   CARDIAC PANEL(CRET KIN+CKTOT+MB+TROPI)     Status: Normal   Collection Time   08/20/11  6:49 PM      Component Value Range   Total CK 55  7 - 232 (U/L)   CK, MB 1.4  0.3 - 4.0 (ng/mL)   Troponin I <0.30  <0.30 (ng/mL)   Relative Index RELATIVE INDEX IS INVALID  0.0 - 2.5   CARDIAC PANEL(CRET KIN+CKTOT+MB+TROPI)     Status: Normal   Collection Time   08/21/11  2:33 AM      Component Value Range   Total CK 51  7 - 232 (U/L)   CK, MB 1.5  0.3 - 4.0 (ng/mL)   Troponin I <0.30  <0.30 (ng/mL)   Relative Index RELATIVE INDEX IS INVALID  0.0 - 2.5   LIPID PANEL     Status: Abnormal   Collection Time   08/21/11  2:34 AM      Component Value Range   Cholesterol 151  0 - 200 (mg/dL)   Triglycerides 960  <454 (mg/dL)   HDL 34 (*) >09 (mg/dL)  Total CHOL/HDL Ratio 4.4     VLDL 24  0 - 40 (mg/dL)   LDL Cholesterol 93  0 - 99 (mg/dL)  CBC     Status: Abnormal   Collection Time   08/21/11  2:40 AM      Component Value Range   WBC 6.7  4.0 - 10.5 (K/uL)   RBC 4.73  4.22 - 5.81 (MIL/uL)   Hemoglobin 15.3  13.0 - 17.0 (g/dL)   HCT 40.9  81.1 - 91.4 (%)   MCV 92.4  78.0 - 100.0 (fL)   MCH 32.3  26.0 - 34.0 (pg)   MCHC 35.0  30.0 - 36.0 (g/dL)   RDW 78.2  95.6 - 21.3 (%)   Platelets 132 (*) 150 - 400 (K/uL)  BASIC METABOLIC PANEL     Status: Normal   Collection Time   08/21/11  2:40 AM      Component Value Range   Sodium 138  135 - 145 (mEq/L)   Potassium 3.8  3.5 - 5.1 (mEq/L)   Chloride 100  96 - 112 (mEq/L)   CO2 30  19 - 32 (mEq/L)   Glucose, Bld 92  70 - 99 (mg/dL)   BUN 15  6 - 23 (mg/dL)   Creatinine, Ser 0.86  0.50 - 1.35 (mg/dL)   Calcium 9.2  8.4 - 57.8 (mg/dL)   GFR calc non Af Amer >90  >90 (mL/min)   GFR calc Af Amer >90  >90 (mL/min)  CARDIAC PANEL(CRET KIN+CKTOT+MB+TROPI)     Status: Normal   Collection Time   08/21/11 10:24 AM      Component Value Range   Total CK 51  7 - 232 (U/L)   CK, MB 1.5  0.3 - 4.0 (ng/mL)    Troponin I <0.30  <0.30 (ng/mL)   Relative Index RELATIVE INDEX IS INVALID  0.0 - 2.5      Micro: No results found for this or any previous visit (from the past 240 hour(s)).  Studies/Results: Dg Chest 2 View  08/20/2011  *RADIOLOGY REPORT*  Clinical Data: Chest pain, right side numbness, history hypertension, smoking  CHEST - 2 VIEW  Comparison: 12/15/2009  Findings: Normal heart size, mediastinal contours, and pulmonary vascularity. Atherosclerotic calcification aortic arch. Chronic bronchitic changes. No pulmonary infiltrate, pleural effusion or pneumothorax. No acute osseous findings.  IMPRESSION: Chronic bronchitic changes.  Original Report Authenticated By: Lollie Marrow, M.D.   Ct Head Wo Contrast  08/20/2011  *RADIOLOGY REPORT*  Clinical Data: Dizziness with standing.  Numbness to right side of face and arm.  Hypertension.  CT HEAD WITHOUT CONTRAST  Technique:  Contiguous axial images were obtained from the base of the skull through the vertex without contrast.  Comparison: 12/15/2009  Findings: Bone windows demonstrate clear paranasal sinuses and mastoid air cells.  Soft tissue windows demonstrate low density in the left thalamus is most consistent with remote lacunar infarct, but is new since 12/15/2009.  Image 15. No  mass lesion, hemorrhage, hydrocephalus, acute infarct, intra-axial, or extra-axial fluid collection.  IMPRESSION:  1.  No acute intracranial abnormality. 2.    Remote left thalamic caudate lacunar infarct.  New since 12/15/2009.  Original Report Authenticated By: Consuello Bossier, M.D.   Mr Maxine Glenn Head Wo Contrast  08/21/2011  *RADIOLOGY REPORT*  Clinical Data: 56 year old male with dizziness and right-sided numbness found have brain stem infarct on MRI.  MRA HEAD WITHOUT CONTRAST  Technique: Angiographic images of the Circle of Willis were obtained using MRA  technique without intravenous contrast.  Comparison: Brain MRI 08/20/2011.  Findings: Antegrade flow in the posterior  circulation with codominant distal vertebral arteries.  No distal vertebral artery stenosis.  Mild tortuosity is noted.  PICA vessels are patent. Normal vertebrobasilar junction and basilar artery.  Normal AICA vessels.  No basilar stenosis or significant irregularity.  Normal superior cerebellar artery origins.  Normal PCA origins.  Both posterior communicating arteries are present.  Bilateral PCA branches are within normal limits.  Antegrade flow in both ICA siphons.  Mild ICA irregularity.  No ICA stenosis.  Ophthalmic and posterior communicating artery origins are within normal limits.  Small anterior choroidal artery infundibula suspected.  Normal carotid termini, MCA and ACA origins.  Anterior communicating artery and visualized ACA branches are within normal limits.  MCA M1 segments are patent.  Visualized bilateral MCA branches are within normal limits; small infundibulum suspected at the distal right M1 directed superiorly (probably lenticulostriate artery related).  IMPRESSION: Negative intracranial MRA.  Original Report Authenticated By: Harley Hallmark, M.D.   Mr Brain Wo Contrast  08/20/2011  *RADIOLOGY REPORT*  Clinical Data: Dizziness.  Right-sided numbness.  MRI HEAD WITHOUT CONTRAST  Technique:  Multiplanar, multiecho pulse sequences of the brain and surrounding structures were obtained according to standard protocol without intravenous contrast.  Comparison: Head CT same day  Findings: Diffusion imaging shows a 3 mm acute infarction within the lower left dorsal pons, possibly with minor involvement of the medulla as well.  No evidence of swelling or hemorrhage.  Elsewhere, the brain shows old lacunar infarctions in the left thalamus and mild chronic small vessel change within the hemispheric white matter.  No cortical or large vessel territory infarction.  No mass lesion, hemorrhage, hydrocephalus or extra- axial collection.  No pituitary mass.  No inflammatory sinus disease.  No skull or skull  base lesion.  IMPRESSION: 3 mm focus of acute infarction within the left dorsal pons. Question minor involvement in the upper medulla as well.  No evidence of swelling or hemorrhage.  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  Minor chronic small vessel change within the hemispheric white matter.  Original Report Authenticated By: Thomasenia Sales, M.D.   US Carotid Duplex Bilateral  08/21/2011  *RADIOLOGY REPORT*  Clinical Data: Stroke, right-sided numbness, history of hypertension and smoking  BILATERAL CAROTID DUPLEX ULTRASOUND  Technique: Gray scale imaging, color Doppler and duplex ultrasound was performed of bilateral carotid and vertebral arteries in the neck.  Comparison:  Brain MRI - 08/20/2011; MRA of the brain - 08/21/2011; carotid Doppler - 12/16/2009  Criteria:  Quantification of carotid stenosis is based on velocity parameters that correlate the residual internal carotid diameter with NASCET-based stenosis levels, using the diameter of the distal internal carotid lumen as the denominator for stenosis measurement.  The following velocity measurements were obtained:                   PEAK SYSTOLIC/END DIASTOLIC RIGHT ICA:                        59/13cm/sec CCA:                        65/7cm/sec SYSTOLIC ICA/CCA RATIO:     0.91 DIASTOLIC ICA/CCA RATIO:    1.79 ECA:                        77cm/sec  LEFT ICA:  63/18cm/sec CCA:                        68/9cm/sec SYSTOLIC ICA/CCA RATIO:     0.93 DIASTOLIC ICA/CCA RATIO:    1.84 ECA:                        85cm/sec  Findings:  RIGHT CAROTID ARTERY: There is minimal intimal wall thickening and noncalcified atherosclerotic plaque within the right carotid bulb extending to involve the proximal aspect of the right internal carotid artery grossly unchanged on the prior examination and again not resulting in hemodynamically significant stenosis.  RIGHT VERTEBRAL ARTERY:  Preserved antegrade flow.  LEFT CAROTID ARTERY: There is  minimal intimal wall thickening and noncalcified atherosclerotic plaque within the carotid bulb and proximal left ICA, grossly unchanged compared with prior examination and again not resulting in hemodynamically significant stenosis.  LEFT VERTEBRAL ARTERY:  Reserve antegrade flow  IMPRESSION:  Grossly unchanged intimal wall thickening and noncalcified atherosclerotic plaque within the bilateral carotid bulbs and proximal internal carotid arteries, not resulting in hemodynamically significant stenosis.  Original Report Authenticated By: Waynard Reeds, M.D.    Medications:  Scheduled Meds:   . ALPRAZolam  0.25 mg Oral Q8H  . amLODipine  10 mg Oral Daily  . aspirin  325 mg Oral Daily  . atenolol  25 mg Oral Daily  . atorvastatin  10 mg Oral q1800  . citalopram  10 mg Oral Daily  . enoxaparin  40 mg Subcutaneous Q24H  . nicotine  7 mg Transdermal Daily  . DISCONTD: aspirin  300 mg Rectal Daily  . DISCONTD: sertraline  50 mg Oral Daily   Continuous Infusions:  PRN Meds:.acetaminophen, acetaminophen, ondansetron (ZOFRAN) IV, senna-docusate, DISCONTD: acetaminophen, DISCONTD: ondansetron (ZOFRAN) IV   Assessment: Principal Problem: #1 Stroke 3 mm focus of acute infarction within the left dorsal pons  risk factors include smoking Hypertension, the patient has had a 2-D echo, carotid Doppler does not show any hemodynamically significant stenosis Continue aspirin PT OT eval  #2 Chest pain EKG and cardiac enzymes have been negative, low likelihood of PE as the patient is not hypoxic or tachycardic  #3 Hypertension continue Norvasc   #4 Alcohol abuse no signs of alcohol withdrawal  #5 Tobacco abuse nicotine patch  #6 Depression stable  Disposition discharged in the morning     LOS: 1 day   [facility] 08/21/2011, 1:32 PM

## 2011-08-22 MED ORDER — ATORVASTATIN CALCIUM 10 MG PO TABS: 10.0000 mg | ORAL_TABLET | Freq: Every day | ORAL | Status: DC

## 2011-08-22 MED ORDER — NICOTINE 7 MG/24HR TD PT24: 1.0000 | MEDICATED_PATCH | Freq: Every day | TRANSDERMAL | Status: AC

## 2011-08-22 MED ORDER — ASPIRIN 325 MG PO TABS: 325.0000 mg | ORAL_TABLET | Freq: Every day | ORAL | Status: DC

## 2011-08-22 NOTE — Progress Notes (Signed)
Pt discharged home today per Dr. Karilyn Cota. Pt's IV site D/C'd and WNL. Pt's VS stable at this time. Pt provided with home medication list, discharge instructions and prescriptions. Pt verbalized understanding. Pt educated on low sodium foods and the importance of checking his weight daily related to his CHF. Pt verbalized understanding. Pt left floor in stable condition accompanied by Ishmael Holter, NT.

## 2011-08-22 NOTE — Discharge Summary (Signed)
Physician Discharge Summary  Patient ID: CACHE BILLS MRN: [id number] DOB/AGE: 55-Jan-1957 55 y.o. Primary Care Physician:HALL,ZACK, MD, MD Admit date: 08/20/2011 Discharge date: 08/22/2011    Discharge Diagnoses:  1. CVA affecting the left dorsal pons, a 3 mm focus. 2. 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 3. Hypertension. 4. Tobacco abuse. 5. History of alcohol abuse. 6. Depression.   Medication List  As of 08/22/2011  1:10 PM   STOP taking these medications         sertraline 50 MG tablet         TAKE these medications         ALPRAZolam 0.25 MG tablet   Commonly known as: XANAX   Take 0.25 mg by mouth every 8 (eight) hours.      amLODipine 10 MG tablet   Commonly known as: NORVASC   Take 10 mg by mouth daily.      aspirin 325 MG tablet   Take 1 tablet (325 mg total) by mouth daily.      atenolol 25 MG tablet   Commonly known as: TENORMIN   Take 25 mg by mouth daily.      atorvastatin 10 MG tablet   Commonly known as: LIPITOR   Take 1 tablet (10 mg total) by mouth daily at 6 PM.      citalopram 10 MG tablet   Commonly known as: CELEXA   Take 10 mg by mouth daily.      nicotine 7 mg/24hr patch   Commonly known as: NICODERM CQ - dosed in mg/24 hr   Place 1 patch onto the skin daily.            Discharged Condition: Stable.    Consults: None.  Significant Diagnostic Studies: Dg Chest 2 View  08/20/2011  *RADIOLOGY REPORT*  Clinical Data: Chest pain, right side numbness, history hypertension, smoking  CHEST - 2 VIEW  Comparison: 12/15/2009  Findings: Normal heart size, mediastinal contours, and pulmonary vascularity. Atherosclerotic calcification aortic arch. Chronic bronchitic changes. No pulmonary infiltrate, pleural effusion or pneumothorax. No acute osseous findings.  IMPRESSION: Chronic bronchitic changes.  Original Report Authenticated By: Lollie Marrow, M.D.   Ct Head Wo Contrast  08/20/2011  *RADIOLOGY REPORT*  Clinical  Data: Dizziness with standing.  Numbness to right side of face and arm.  Hypertension.  CT HEAD WITHOUT CONTRAST  Technique:  Contiguous axial images were obtained from the base of the skull through the vertex without contrast.  Comparison: 12/15/2009  Findings: Bone windows demonstrate clear paranasal sinuses and mastoid air cells.  Soft tissue windows demonstrate low density in the left thalamus is most consistent with remote lacunar infarct, but is new since 12/15/2009.  Image 15. No  mass lesion, hemorrhage, hydrocephalus, acute infarct, intra-axial, or extra-axial fluid collection.  IMPRESSION:  1.  No acute intracranial abnormality. 2.    Remote left thalamic caudate lacunar infarct.  New since 12/15/2009.  Original Report Authenticated By: Consuello Bossier, M.D.   Mr Maxine Glenn Head Wo Contrast  08/21/2011  *RADIOLOGY REPORT*  Clinical Data: 56 year old male with dizziness and right-sided numbness found have brain stem infarct on MRI.  MRA HEAD WITHOUT CONTRAST  Technique: Angiographic images of the Circle of Willis were obtained using MRA technique without intravenous contrast.  Comparison: Brain MRI 08/20/2011.  Findings: Antegrade flow in the posterior circulation with codominant distal vertebral arteries.  No distal vertebral artery stenosis.  Mild tortuosity is noted.  PICA vessels are patent. Normal vertebrobasilar junction  and basilar artery.  Normal AICA vessels.  No basilar stenosis or significant irregularity.  Normal superior cerebellar artery origins.  Normal PCA origins.  Both posterior communicating arteries are present.  Bilateral PCA branches are within normal limits.  Antegrade flow in both ICA siphons.  Mild ICA irregularity.  No ICA stenosis.  Ophthalmic and posterior communicating artery origins are within normal limits.  Small anterior choroidal artery infundibula suspected.  Normal carotid termini, MCA and ACA origins.  Anterior communicating artery and visualized ACA branches are within  normal limits.  MCA M1 segments are patent.  Visualized bilateral MCA branches are within normal limits; small infundibulum suspected at the distal right M1 directed superiorly (probably lenticulostriate artery related).  IMPRESSION: Negative intracranial MRA.  Original Report Authenticated By: Harley [facility]mark, M.D.   Mr Brain Wo Contrast  08/20/2011  *RADIOLOGY REPORT*  Clinical Data: Dizziness.  Right-sided numbness.  MRI HEAD WITHOUT CONTRAST  Technique:  Multiplanar, multiecho pulse sequences of the brain and surrounding structures were obtained according to standard protocol without intravenous contrast.  Comparison: Head CT same day  Findings: Diffusion imaging shows a 3 mm acute infarction within the lower left dorsal pons, possibly with minor involvement of the medulla as well.  No evidence of swelling or hemorrhage.  Elsewhere, the brain shows old lacunar infarctions in the left thalamus and mild chronic small vessel change within the hemispheric white matter.  No cortical or large vessel territory infarction.  No mass lesion, hemorrhage, hydrocephalus or extra- axial collection.  No pituitary mass.  No inflammatory sinus disease.  No skull or skull base lesion.  IMPRESSION: 3 mm focus of acute infarction within the left dorsal pons. Question minor involvement in the upper medulla as well.  No evidence of swelling or hemorrhage.  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  Minor chronic small vessel change within the hemispheric white matter.  Original Report Authenticated By: Thomasenia Sales, M.D.   US Carotid Duplex Bilateral  08/21/2011  *RADIOLOGY REPORT*  Clinical Data: Stroke, right-sided numbness, history of hypertension and smoking  BILATERAL CAROTID DUPLEX ULTRASOUND  Technique: Gray scale imaging, color Doppler and duplex ultrasound was performed of bilateral carotid and vertebral arteries in the neck.  Comparison:  Brain MRI - 08/20/2011; MRA of the brain - 08/21/2011; carotid  Doppler - 12/16/2009  Criteria:  Quantification of carotid stenosis is based on velocity parameters that correlate the residual internal carotid diameter with NASCET-based stenosis levels, using the diameter of the distal internal carotid lumen as the denominator for stenosis measurement.  The following velocity measurements were obtained:                   PEAK SYSTOLIC/END DIASTOLIC RIGHT ICA:                        59/13cm/sec CCA:                        65/7cm/sec SYSTOLIC ICA/CCA RATIO:     0.91 DIASTOLIC ICA/CCA RATIO:    1.79 ECA:                        77cm/sec  LEFT ICA:                        63/18cm/sec CCA:  68/9cm/sec SYSTOLIC ICA/CCA RATIO:     0.93 DIASTOLIC ICA/CCA RATIO:    1.84 ECA:                        85cm/sec  Findings:  RIGHT CAROTID ARTERY: There is minimal intimal wall thickening and noncalcified atherosclerotic plaque within the right carotid bulb extending to involve the proximal aspect of the right internal carotid artery grossly unchanged on the prior examination and again not resulting in hemodynamically significant stenosis.  RIGHT VERTEBRAL ARTERY:  Preserved antegrade flow.  LEFT CAROTID ARTERY: There is minimal intimal wall thickening and noncalcified atherosclerotic plaque within the carotid bulb and proximal left ICA, grossly unchanged compared with prior examination and again not resulting in hemodynamically significant stenosis.  LEFT VERTEBRAL ARTERY:  Reserve antegrade flow  IMPRESSION:  Grossly unchanged intimal wall thickening and noncalcified atherosclerotic plaque within the bilateral carotid bulbs and proximal internal carotid arteries, not resulting in hemodynamically significant stenosis.  Original Report Authenticated By: Waynard Reeds, M.D.   Patient Information      Name  MRN   Description     MAURION WALKOWIAK  [id number]   56 year old Male              Result Narrative     *[facility]* [address] [place], [place] [address] [phone]  ------------------------------------------------------------ Transthoracic Echocardiography  Patient: Zeplin, Aleshire MR #: [id number] Study Date: 08/21/2011 Gender: M Age: 22 Height: 170.2cm Weight: 68.5kg BSA: 1.7m^2 Pt. Status: Room: A327  SONOGRAPHER Karrie Doffing ATTENDING Dione Booze PERFORMING Delma Freeze Penn ADMITTING Memon, Normanna ORDERING Memon, Jehanzeb REFERRING [place], [place] cc:  ------------------------------------------------------------ LV EF: 60% - 65%  ------------------------------------------------------------ Indications: CVA 436.  ------------------------------------------------------------ History: PMH: Chest pain. PMH: ETOH, thrombocytopenia Risk factors: Family history of coronary artery disease. Current tobacco use. Hypertension.  ------------------------------------------------------------ Study Conclusions  - Left ventricle: The cavity size was normal. There was mild concentric hypertrophy. Systolic function was normal. The estimated ejection fraction was in the range of 60% to 65%. Wall motion was normal; there were no regional wall motion abnormalities. - Aortic valve: Mildly to moderately calcified annulus. Mildly thickened, mildly calcified leaflets. Mild regurgitation. - Atrial septum: No defect or patent foramen ovale was identified. Transthoracic echocardiography. M-mode, complete 2D, spectral Doppler, and color Doppler. Height: Height: 170.2cm. Height: 67in. Weight: Weight: 68.5kg. Weight: 150.7lb. Body mass index: BMI: 23.7kg/m^2. Body surface area: BSA: 1.85m^2. Patient status: Inpatient. Location: Bedside.     Lab Results: Basic Metabolic Panel:  Basename 08/21/11 0240 08/20/11 1249  NA 138 138  K 3.8 3.7  CL 100 101  CO2 30 25  GLUCOSE 92 115*  BUN 15 11  CREATININE 0.85 0.70  CALCIUM 9.2 9.3  MG -- --  PHOS -- --   Liver Function Tests:  Basename  08/20/11 1249  AST 97*  ALT 109*  ALKPHOS 84  BILITOT 0.7  PROT 8.2  ALBUMIN 4.0     CBC:  Basename 08/21/11 0240 08/20/11 1249  WBC 6.7 7.6  NEUTROABS -- 5.9  HGB 15.3 16.4  HCT 43.7 46.2  MCV 92.4 91.5  PLT 132* 140*       Hospital Course: This 56 year old man was admitted to the hospital with symptoms of right arm and face numbness. Also the right leg was involved. He did not have any significant weakness of the right side. MRI brain scan showed acute 3 mm focus CVA in the dorsal left  pons. He has had no further neurological deficits. He has done well in the hospital. Bilateral carotid Dopplers did not indicate any hemodynamically significant stenosis. Echocardiogram was unremarkable. His LDL cholesterol is 93. However his HDL cholesterol is reduced at 34. Patient has been started on a statin.  Discharge Exam: Blood pressure 163/83, pulse 50, temperature 97.9 F (36.6 C), temperature source Oral, resp. rate 20, height 5\' 7"  (1.702 m), weight 68.8 kg (151 lb 10.8 oz), SpO2 96.00%. He looks systemically well. There is no weakness in the right side compared to the left. Heart sounds are present and normal. There are no carotid bruits. Lung fields are clear. He is alert and orientated.  Disposition: Home. He has been counseled with regard to tobacco cessation. He will follow with his primary care physician in the next week or so.  Discharge Orders    Future Orders Please Complete By Expires   Diet - low sodium heart healthy      Increase activity slowly         Follow-up Information    Follow up with [facility], [place] .         SignedWilson Singer Pager [phone]  08/22/2011, 1:10 PM

## 2011-10-09 ENCOUNTER — Other Ambulatory Visit: Payer: Self-pay

## 2011-10-09 ENCOUNTER — Encounter (HOSPITAL_COMMUNITY): Payer: Self-pay | Admitting: *Deleted

## 2011-10-09 ENCOUNTER — Telehealth (HOSPITAL_COMMUNITY): Payer: Self-pay | Admitting: *Deleted

## 2011-10-09 ENCOUNTER — Emergency Department (HOSPITAL_COMMUNITY)
Admission: EM | Admit: 2011-10-09 | Discharge: 2011-10-10 | Disposition: A | Discharge status: Home / Self Care | Payer: Self-pay | Attending: Emergency Medicine | Admitting: Emergency Medicine

## 2011-10-09 ENCOUNTER — Emergency Department (HOSPITAL_COMMUNITY): Payer: Self-pay

## 2011-10-09 DIAGNOSIS — F1010 Alcohol abuse, uncomplicated: Secondary | ICD-10-CM | POA: Insufficient documentation

## 2011-10-09 DIAGNOSIS — F329 Major depressive disorder, single episode, unspecified: Secondary | ICD-10-CM | POA: Insufficient documentation

## 2011-10-09 DIAGNOSIS — F17200 Nicotine dependence, unspecified, uncomplicated: Secondary | ICD-10-CM | POA: Insufficient documentation

## 2011-10-09 DIAGNOSIS — F10929 Alcohol use, unspecified with intoxication, unspecified: Secondary | ICD-10-CM

## 2011-10-09 DIAGNOSIS — Z79899 Other long term (current) drug therapy: Secondary | ICD-10-CM | POA: Insufficient documentation

## 2011-10-09 DIAGNOSIS — I10 Essential (primary) hypertension: Secondary | ICD-10-CM | POA: Insufficient documentation

## 2011-10-09 DIAGNOSIS — R45851 Suicidal ideations: Secondary | ICD-10-CM | POA: Insufficient documentation

## 2011-10-09 DIAGNOSIS — F3289 Other specified depressive episodes: Secondary | ICD-10-CM | POA: Insufficient documentation

## 2011-10-09 LAB — DIFFERENTIAL
Basophils Absolute: 0.1 10*3/uL (ref 0.0–0.1)
Eosinophils Relative: 2 % (ref 0–5)
Lymphocytes Relative: 36 % (ref 12–46)
Monocytes Absolute: 0.6 10*3/uL (ref 0.1–1.0)
Monocytes Relative: 7 % (ref 3–12)

## 2011-10-09 LAB — COMPREHENSIVE METABOLIC PANEL
AST: 61 U/L — ABNORMAL HIGH (ref 0–37)
Albumin: 4.1 g/dL (ref 3.5–5.2)
BUN: 7 mg/dL (ref 6–23)
CO2: 22 mEq/L (ref 19–32)
Calcium: 9.5 mg/dL (ref 8.4–10.5)
Creatinine, Ser: 0.84 mg/dL (ref 0.50–1.35)
GFR calc non Af Amer: >90 mL/min (ref >90)

## 2011-10-09 LAB — RAPID URINE DRUG SCREEN, HOSP PERFORMED
Cocaine: NOT DETECTED
Opiates: NOT DETECTED
Tetrahydrocannabinol: NOT DETECTED

## 2011-10-09 LAB — CBC
MCH: 31.8 pg (ref 26.0–34.0)
MCV: 90.8 fL (ref 78.0–100.0)
Platelets: 187 10*3/uL (ref 150–400)
RDW: 12.8 % (ref 11.5–15.5)

## 2011-10-09 LAB — PROTIME-INR: INR: 1.11 (ref 0.00–1.49)

## 2011-10-09 LAB — URINALYSIS, ROUTINE W REFLEX MICROSCOPIC
Bilirubin Urine: NEGATIVE
Glucose, UA: NEGATIVE mg/dL
Ketones, ur: NEGATIVE mg/dL
Nitrite: NEGATIVE
Specific Gravity, Urine: <1.005 — ABNORMAL LOW (ref 1.005–1.030)
pH: 5.5 (ref 5.0–8.0)

## 2011-10-09 LAB — ETHANOL: Alcohol, Ethyl (B): 185 mg/dL — ABNORMAL HIGH (ref 0–11)

## 2011-10-09 LAB — APTT: aPTT: 37 seconds (ref 24–37)

## 2011-10-09 LAB — URINE MICROSCOPIC-ADD ON

## 2011-10-09 LAB — SALICYLATE LEVEL: Salicylate Lvl: <2 mg/dL — ABNORMAL LOW (ref 2.8–20.0)

## 2011-10-09 LAB — ACETAMINOPHEN LEVEL: Acetaminophen (Tylenol), Serum: <15 ug/mL (ref 10–30)

## 2011-10-09 MED ORDER — NICOTINE 21 MG/24HR TD PT24
21.0000 mg | MEDICATED_PATCH | Freq: Once | TRANSDERMAL | Status: DC
  Administered 2011-10-09: 21 mg via TRANSDERMAL
  Filled 2011-10-09: qty 1

## 2011-10-09 NOTE — ED Notes (Signed)
Pt arrived via [facility], pt is a pt at [facility] and today had thoughts of "stepping in front of a train",

## 2011-10-09 NOTE — ED Provider Notes (Cosign Needed)
History  This chart was scribed for Jesse Cooper III, MD by [name]. The patient was seen in room APA17/APA17. Patient's care was started at 2001.  CSN: [id number]  Arrival date & time 10/09/11  2001   First MD Initiated Contact with Patient 10/09/11 2025      Chief Complaint  Patient presents with  . Medical Clearance    (Consider location/radiation/quality/duration/timing/severity/associated sxs/prior treatment) The history is provided by the patient. No language interpreter was used.    DAEQUAN Stafford is a 56 y.o. male with a recent history of a stroke who presents to the Emergency Department complaining of gradually worsening, constant depression with associated suicidal ideation. Pt that he had a stoke about 6 weeks ago and was hospitalized at [facility]. Pt states that he still has some weakness from stroke. Pt states that since being discharged, he has become increasingly depressed. Pt reports that he is not getting along with his brother and that he "has no money and doesn't know what to do." Pt is unable to pay for electricity, food, or his hospital bills. Pt had thoughts today of "stepping in front of a train." Pt reports that he currently smokes about a pack a week and "doesn't drink much alcohol."  Past Medical History  Diagnosis Date  . Chest pain 2007    Associated with syncope in 2011  . Hypertension     Echo in 2011-mild LVH, hyperdynamic LV function, mild to moderate AI  . Alcohol abuse   . Tobacco abuse     20 pack years; 1/2 pack per day  . Thrombocytopenia     possible alcohol toxicity; 122,000  . Chronic sinusitis     By CT  . Chest pain   . Stroke     History reviewed. No pertinent past surgical history.  Family History  Problem Relation Age of Onset  . Hypertension    . Alcohol abuse Neg Hx   . Coronary artery disease Mother 86    CABG  . Coronary artery disease Father 22    CABG    History  Substance Use Topics  . Smoking status:  Current Everyday Smoker -- 1.0 packs/day for 40 years  . Smokeless tobacco: Never Used  . Alcohol Use: 15.0 oz/week    30 drink(s) per week     Multiple hospital admissions with chest pain and inebriation      Review of Systems  Constitutional: Negative for fever and appetite change.  HENT: Negative for ear pain and neck pain.   Respiratory: Negative for cough and shortness of breath.   Cardiovascular: Negative for chest pain.  Gastrointestinal: Negative for nausea and vomiting.  Neurological: Positive for weakness. Negative for speech difficulty.  Psychiatric/Behavioral: Positive for suicidal ideas. Negative for hallucinations and confusion.  All other systems reviewed and are negative.    Allergies  Review of patient's allergies indicates no known allergies.  Home Medications   Current Outpatient Rx  Name Route Sig Dispense Refill  . ALPRAZOLAM 0.25 MG PO TABS Oral Take 0.25 mg by mouth every 8 (eight) hours.    . AMLODIPINE BESYLATE 10 MG PO TABS Oral Take 10 mg by mouth daily.    . ASPIRIN 325 MG PO TABS Oral Take 1 tablet (325 mg total) by mouth daily. 30 tablet 0  . ATENOLOL 25 MG PO TABS Oral Take 25 mg by mouth daily.    . ATORVASTATIN CALCIUM 10 MG PO TABS Oral Take 1 tablet (10 mg total)  by mouth daily at 6 PM. 30 tablet 0  . CITALOPRAM HYDROBROMIDE 10 MG PO TABS Oral Take 10 mg by mouth daily.      Triage Vitals: BP 156/87  Pulse 87  Temp(Src) 97.6 F (36.4 C) (Oral)  Resp 18  Ht 5\' 7"  (1.702 m)  Wt 157 lb (71.215 kg)  BMI 24.59 kg/m2  SpO2 97%  Physical Exam  Nursing note and vitals reviewed. Constitutional: He is oriented to person, place, and time. He appears well-developed and well-nourished.  HENT:  Head: Normocephalic and atraumatic.  Eyes: Conjunctivae and EOM are normal. Pupils are equal, round, and reactive to light. No scleral icterus.  Neck: Normal range of motion. Neck supple. No thyromegaly present.  Cardiovascular: Normal rate, regular  rhythm and normal heart sounds.  Exam reveals no gallop and no friction rub.   No murmur heard. Pulmonary/Chest: Effort normal and breath sounds normal. No stridor. No respiratory distress. He has no wheezes. He has no rales. He exhibits no tenderness.  Abdominal: Soft. Bowel sounds are normal. He exhibits no distension. There is no tenderness. There is no rebound.  Musculoskeletal: Normal range of motion. He exhibits no edema.  Lymphadenopathy:    He has no cervical adenopathy.  Neurological: He is alert and oriented to person, place, and time. Coordination normal.       No gross neurological deficit  Skin: Skin is warm and dry. No rash noted. No erythema.  Psychiatric:       Depressed about financial situation and arguments with brother, suicidal ideation with plan of stepping in front of train.    ED Course  Procedures (including critical care time)  DIAGNOSTIC STUDIES: Oxygen Saturation is 97% on room air, adequate by my interpretation.    COORDINATION OF CARE: 8:58PM - discussed tests with pt. Pt agrees.   9:52 PM  Date: 10/09/2011  Rate: 81  Rhythm: normal sinus rhythm  QRS Axis: normal  Intervals: normal QRS:  Poor R wave progression in precordial leads suggests possible old anterior myocardial infarction.  ST/T Wave abnormalities: normal  Conduction Disutrbances:none  Narrative Interpretation: Abnormal EKG.   Old EKG Reviewed: unchanged  12:16 AM Results for orders placed during the hospital encounter of 10/09/11  CBC      Component Value Range   WBC 9.0  4.0 - 10.5 (K/uL)   RBC 5.34  4.22 - 5.81 (MIL/uL)   Hemoglobin 17.0  13.0 - 17.0 (g/dL)   HCT 47.8  29.5 - 62.1 (%)   MCV 90.8  78.0 - 100.0 (fL)   MCH 31.8  26.0 - 34.0 (pg)   MCHC 35.1  30.0 - 36.0 (g/dL)   RDW 30.8  65.7 - 84.6 (%)   Platelets 187  150 - 400 (K/uL)  COMPREHENSIVE METABOLIC PANEL      Component Value Range   Sodium 140  135 - 145 (mEq/L)   Potassium 4.2  3.5 - 5.1 (mEq/L)   Chloride  107  96 - 112 (mEq/L)   CO2 22  19 - 32 (mEq/L)   Glucose, Bld 115 (*) 70 - 99 (mg/dL)   BUN 7  6 - 23 (mg/dL)   Creatinine, Ser 9.62  0.50 - 1.35 (mg/dL)   Calcium 9.5  8.4 - 95.2 (mg/dL)   Total Protein 8.7 (*) 6.0 - 8.3 (g/dL)   Albumin 4.1  3.5 - 5.2 (g/dL)   AST 61 (*) 0 - 37 (U/L)   ALT 61 (*) 0 - 53 (U/L)   Alkaline  Phosphatase 83  39 - 117 (U/L)   Total Bilirubin 0.3  0.3 - 1.2 (mg/dL)   GFR calc non Af Amer >90  >90 (mL/min)   GFR calc Af Amer >90  >90 (mL/min)  ETHANOL      Component Value Range   Alcohol, Ethyl (B) 185 (*) 0 - 11 (mg/dL)  URINE RAPID DRUG SCREEN (HOSP PERFORMED)      Component Value Range   Opiates NONE DETECTED  NONE DETECTED    Cocaine NONE DETECTED  NONE DETECTED    Benzodiazepines NONE DETECTED  NONE DETECTED    Amphetamines NONE DETECTED  NONE DETECTED    Tetrahydrocannabinol NONE DETECTED  NONE DETECTED    Barbiturates NONE DETECTED  NONE DETECTED   DIFFERENTIAL      Component Value Range   Neutrophils Relative 54  43 - 77 (%)   Neutro Abs 4.6  1.7 - 7.7 (K/uL)   Lymphocytes Relative 36  12 - 46 (%)   Lymphs Abs 3.2  0.7 - 4.0 (K/uL)   Monocytes Relative 7  3 - 12 (%)   Monocytes Absolute 0.6  0.1 - 1.0 (K/uL)   Eosinophils Relative 2  0 - 5 (%)   Eosinophils Absolute 0.2  0.0 - 0.7 (K/uL)   Basophils Relative 1  0 - 1 (%)   Basophils Absolute 0.1  0.0 - 0.1 (K/uL)  URINALYSIS, ROUTINE W REFLEX MICROSCOPIC      Component Value Range   Color, Urine YELLOW  YELLOW    APPearance CLEAR  CLEAR    Specific Gravity, Urine <1.005 (*) 1.005 - 1.030    pH 5.5  5.0 - 8.0    Glucose, UA NEGATIVE  NEGATIVE (mg/dL)   Hgb urine dipstick SMALL (*) NEGATIVE    Bilirubin Urine NEGATIVE  NEGATIVE    Ketones, ur NEGATIVE  NEGATIVE (mg/dL)   Protein, ur NEGATIVE  NEGATIVE (mg/dL)   Urobilinogen, UA 0.2  0.0 - 1.0 (mg/dL)   Nitrite NEGATIVE  NEGATIVE    Leukocytes, UA NEGATIVE  NEGATIVE   PROTIME-INR      Component Value Range   Prothrombin Time 14.5   11.6 - 15.2 (seconds)   INR 1.11  0.00 - 1.49   APTT      Component Value Range   aPTT 37  24 - 37 (seconds)  ACETAMINOPHEN LEVEL      Component Value Range   Acetaminophen (Tylenol), Serum <15.0  10 - 30 (ug/mL)  SALICYLATE LEVEL      Component Value Range   Salicylate Lvl <2.0 (*) 2.8 - 20.0 (mg/dL)  URINE MICROSCOPIC-ADD ON      Component Value Range   Squamous Epithelial / LPF RARE  RARE    WBC, UA 0-2  <3 (WBC/hpf)   RBC / HPF 0-2  <3 (RBC/hpf)   Bacteria, UA RARE  RARE    Dg Chest 2 View  10/09/2011  *RADIOLOGY REPORT*  Clinical Data: Chest pain  CHEST - 2 VIEW  Comparison: 08/20/2011  Findings: Poor inspiratory effort.  Normal cardiomediastinal silhouette.  No infiltrates, effusion, or pneumothorax.  Negative osseous structures. Slight chronic bronchitic change redemonstrated and stable. Slightly decreased lung volumes.  IMPRESSION: Poor inspiratory effort.  Slight bronchitic change.  No active infiltrates.  Original Report Authenticated By: Elsie Stain, M.D.    12:16 AM Lab workup shows elevated blood alcohol of 185; other tests negative.  Medically cleared for psychiatric treatment.   12:35 AM Pt now talking about leaving.  He has been seen by the [facility] counselor, who has faxed his information to [facility].  Where he has expressed suicidal ideation, I completed involuntary commitment papers on him.    1. Suicidal ideation   2. Depression   3. Alcohol intoxication      I personally performed the services described in this documentation, which was scribed in my presence. The recorded information has been reviewed and considered.  Osvaldo Human, M.D.         Jesse Cooper III, MD 10/10/11 1610  Jesse Cooper III, MD 10/10/11 [phone]

## 2011-10-09 NOTE — ED Notes (Signed)
Pt. called and said he had a stroke last month, and he and his brother are not getting along. Pt. sounds agitated. I said you sound upset. He said he is shaking because he is so mad at his brother. States his brother is not helping him. States he has "no money and I don't know what to do."  Pt.lives in a trailer with his brother and everything is broken. States he gets $200.00 in food stamps but his brother eats up all his food.  I asked if he has a SW, to try and get disability after his stroke. He said that takes years. Pt. sounds depressed. I asked if he was going to harm himself or his brother and he said that would be the easy way out. He could just jump off the bridge in front of the train He said his best friend killed himself with his gun. Pt. told to call 911 and go to ED. States he has a Optometrist for EMS from when he had his stroke and owes the hospital thousands of dollars. I told him to call 911 and the police would take him. He did not want to do that. I asked if there was any other family or friend who could drive him to the ED.  He said no.  It is only he and his brother and they do not have a car.  I told pt. to call [facility] @ [phone].  He said he would do that. Vassie Moselle 10/09/2011

## 2011-10-09 NOTE — ED Notes (Signed)
Pt talking with sitter. No acute distress noted. Pt asking when he will be evaluated. Reassured pt he would be seen.
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
## 2011-10-09 NOTE — ED Notes (Signed)
Resting in bed sitting up. No distress. Equal chest rise and fall, regular, unlabored. Call bell and sitter at bedside.

## 2011-10-09 NOTE — ED Notes (Signed)
Patient transported to Xray. NAD noted

## 2011-10-10 ENCOUNTER — Encounter (HOSPITAL_COMMUNITY): Payer: Self-pay | Admitting: *Deleted

## 2011-10-10 ENCOUNTER — Inpatient Hospital Stay (HOSPITAL_COMMUNITY)
Admission: EM | Admit: 2011-10-10 | Discharge: 2011-10-15 | DRG: 882 | Disposition: A | Discharge status: Home / Self Care | Payer: 59 | Source: Ambulatory Visit | Attending: Psychiatry | Admitting: Psychiatry

## 2011-10-10 DIAGNOSIS — I639 Cerebral infarction, unspecified: Secondary | ICD-10-CM

## 2011-10-10 DIAGNOSIS — F17200 Nicotine dependence, unspecified, uncomplicated: Secondary | ICD-10-CM | POA: Diagnosis present

## 2011-10-10 DIAGNOSIS — F1010 Alcohol abuse, uncomplicated: Secondary | ICD-10-CM

## 2011-10-10 DIAGNOSIS — F332 Major depressive disorder, recurrent severe without psychotic features: Secondary | ICD-10-CM

## 2011-10-10 DIAGNOSIS — Z79899 Other long term (current) drug therapy: Secondary | ICD-10-CM

## 2011-10-10 DIAGNOSIS — F4323 Adjustment disorder with mixed anxiety and depressed mood: Principal | ICD-10-CM | POA: Diagnosis present

## 2011-10-10 DIAGNOSIS — D696 Thrombocytopenia, unspecified: Secondary | ICD-10-CM

## 2011-10-10 DIAGNOSIS — Z720 Tobacco use: Secondary | ICD-10-CM

## 2011-10-10 DIAGNOSIS — R5383 Other fatigue: Secondary | ICD-10-CM | POA: Diagnosis present

## 2011-10-10 DIAGNOSIS — R45851 Suicidal ideations: Secondary | ICD-10-CM

## 2011-10-10 DIAGNOSIS — F339 Major depressive disorder, recurrent, unspecified: Secondary | ICD-10-CM | POA: Diagnosis present

## 2011-10-10 DIAGNOSIS — I69998 Other sequelae following unspecified cerebrovascular disease: Secondary | ICD-10-CM

## 2011-10-10 DIAGNOSIS — I10 Essential (primary) hypertension: Secondary | ICD-10-CM | POA: Diagnosis present

## 2011-10-10 DIAGNOSIS — R5381 Other malaise: Secondary | ICD-10-CM | POA: Diagnosis present

## 2011-10-10 HISTORY — DX: Depression, unspecified: F32.A

## 2011-10-10 HISTORY — DX: Major depressive disorder, single episode, unspecified: F32.9

## 2011-10-10 LAB — TRICYCLICS SCREEN, URINE: TCA Scrn: NOT DETECTED

## 2011-10-10 MED ORDER — CHLORDIAZEPOXIDE HCL 25 MG PO CAPS
25.0000 mg | ORAL_CAPSULE | Freq: Four times a day (QID) | ORAL | Status: AC
  Administered 2011-10-10 – 2011-10-11 (×6): 25 mg via ORAL
  Filled 2011-10-10 (×5): qty 1

## 2011-10-10 MED ORDER — CHLORDIAZEPOXIDE HCL 25 MG PO CAPS
25.0000 mg | ORAL_CAPSULE | Freq: Four times a day (QID) | ORAL | Status: AC | PRN
  Administered 2011-10-10: 25 mg via ORAL
  Filled 2011-10-10: qty 1

## 2011-10-10 MED ORDER — VITAMIN B-1 100 MG PO TABS
100.0000 mg | ORAL_TABLET | Freq: Every day | ORAL | Status: DC
  Administered 2011-10-11 – 2011-10-15 (×5): 100 mg via ORAL
  Filled 2011-10-10 (×7): qty 1

## 2011-10-10 MED ORDER — MAGNESIUM HYDROXIDE 400 MG/5ML PO SUSP
30.0000 mL | Freq: Every day | ORAL | Status: DC | PRN
Start: 2011-10-10 — End: 2011-10-15

## 2011-10-10 MED ORDER — CHLORDIAZEPOXIDE HCL 25 MG PO CAPS
25.0000 mg | ORAL_CAPSULE | Freq: Three times a day (TID) | ORAL | Status: AC
  Administered 2011-10-12 (×3): 25 mg via ORAL
  Filled 2011-10-10 (×4): qty 1

## 2011-10-10 MED ORDER — AMLODIPINE BESYLATE 10 MG PO TABS
10.0000 mg | ORAL_TABLET | Freq: Every day | ORAL | Status: DC
  Administered 2011-10-10 – 2011-10-15 (×6): 10 mg via ORAL
  Filled 2011-10-10 (×8): qty 1
  Filled 2011-10-10: qty 2

## 2011-10-10 MED ORDER — ACETAMINOPHEN 325 MG PO TABS
650.0000 mg | ORAL_TABLET | Freq: Four times a day (QID) | ORAL | Status: DC | PRN
  Administered 2011-10-14 – 2011-10-15 (×2): 650 mg via ORAL

## 2011-10-10 MED ORDER — CHLORDIAZEPOXIDE HCL 25 MG PO CAPS: 25.0000 mg | ORAL_CAPSULE | Freq: Four times a day (QID) | ORAL | Status: DC | PRN

## 2011-10-10 MED ORDER — ASPIRIN 81 MG PO CHEW
81.0000 mg | CHEWABLE_TABLET | Freq: Every day | ORAL | Status: DC
  Administered 2011-10-10 – 2011-10-15 (×6): 81 mg via ORAL
  Filled 2011-10-10 (×9): qty 1

## 2011-10-10 MED ORDER — THIAMINE HCL 100 MG/ML IJ SOLN
100.0000 mg | Freq: Once | INTRAMUSCULAR | Status: AC
  Administered 2011-10-10: 100 mg via INTRAMUSCULAR

## 2011-10-10 MED ORDER — NICOTINE 21 MG/24HR TD PT24
21.0000 mg | MEDICATED_PATCH | Freq: Every day | TRANSDERMAL | Status: DC
  Administered 2011-10-10 – 2011-10-15 (×6): 21 mg via TRANSDERMAL
  Filled 2011-10-10 (×9): qty 1

## 2011-10-10 MED ORDER — ALUM & MAG HYDROXIDE-SIMETH 200-200-20 MG/5ML PO SUSP: 30.0000 mL | ORAL | Status: DC | PRN

## 2011-10-10 MED ORDER — ATENOLOL 25 MG PO TABS
25.0000 mg | ORAL_TABLET | ORAL | Status: DC
  Filled 2011-10-10: qty 1

## 2011-10-10 MED ORDER — CITALOPRAM HYDROBROMIDE 20 MG PO TABS
20.0000 mg | ORAL_TABLET | Freq: Every day | ORAL | Status: DC
  Administered 2011-10-10 – 2011-10-15 (×6): 20 mg via ORAL
  Filled 2011-10-10 (×9): qty 1

## 2011-10-10 MED ORDER — ADULT MULTIVITAMIN W/MINERALS CH
1.0000 | ORAL_TABLET | Freq: Every day | ORAL | Status: DC
  Administered 2011-10-11 – 2011-10-15 (×5): 1 via ORAL
  Filled 2011-10-10 (×8): qty 1

## 2011-10-10 MED ORDER — CHLORDIAZEPOXIDE HCL 25 MG PO CAPS
25.0000 mg | ORAL_CAPSULE | ORAL | Status: AC
  Administered 2011-10-13 (×2): 25 mg via ORAL
  Filled 2011-10-10 (×2): qty 1

## 2011-10-10 MED ORDER — HYDROXYZINE HCL 25 MG PO TABS: 25.0000 mg | ORAL_TABLET | Freq: Four times a day (QID) | ORAL | Status: AC | PRN

## 2011-10-10 MED ORDER — ADULT MULTIVITAMIN W/MINERALS CH
1.0000 | ORAL_TABLET | Freq: Every day | ORAL | Status: DC
  Administered 2011-10-10: 1 via ORAL
  Filled 2011-10-10 (×3): qty 1

## 2011-10-10 MED ORDER — CHLORDIAZEPOXIDE HCL 25 MG PO CAPS
25.0000 mg | ORAL_CAPSULE | Freq: Every day | ORAL | Status: AC
  Administered 2011-10-14: 25 mg via ORAL
  Filled 2011-10-10: qty 1

## 2011-10-10 MED ORDER — ONDANSETRON 4 MG PO TBDP: 4.0000 mg | ORAL_TABLET | Freq: Four times a day (QID) | ORAL | Status: AC | PRN

## 2011-10-10 MED ORDER — HYDROCORTISONE 2.5 % RE CREA
TOPICAL_CREAM | Freq: Two times a day (BID) | RECTAL | Status: DC
  Filled 2011-10-10: qty 28.35

## 2011-10-10 MED ORDER — ATENOLOL 50 MG PO TABS
25.0000 mg | ORAL_TABLET | ORAL | Status: DC
  Administered 2011-10-10 – 2011-10-15 (×6): 25 mg via ORAL
  Filled 2011-10-10: qty 1
  Filled 2011-10-10: qty 0.5
  Filled 2011-10-10: qty 1
  Filled 2011-10-10: qty 0.5
  Filled 2011-10-10 (×2): qty 1
  Filled 2011-10-10 (×4): qty 0.5

## 2011-10-10 MED ORDER — LOPERAMIDE HCL 2 MG PO CAPS: 2.0000 mg | ORAL_CAPSULE | ORAL | Status: AC | PRN

## 2011-10-10 NOTE — ED Notes (Signed)
Report given to Fransico Michael, RN at [facility].

## 2011-10-10 NOTE — Progress Notes (Signed)
Pt has been up and active in the millie today.  He rated both his depression and oaplessness a 10 and denied any anxiety on his self-inventory.  He did check he was suicidal but claims that was yesterday before he came in here.  However, he does contract for safety on the unit.  His bp was 187/101 and pulse 98 around 1500 and was given medications that NP had just written along with a prn librium.  Pt denied any withdrawal symptoms at this time so CIWA 0.

## 2011-10-10 NOTE — Progress Notes (Signed)
Pt. In bed resting quietly with eyes open reporting that he is "Just tired" and has not had any rest in 30 hours.  Flat affect, depressed mood but denies SI/HI and denies A/V hallucinations.  Denies pain.  No other issues voiced at present.

## 2011-10-10 NOTE — Progress Notes (Signed)
[facility] Group Notes:  (Counselor/Nursing/MHT/Case Management/Adjunct)  10/10/2011 4:15 PM  Type of Therapy:  Group Therapy at 11:00  Participation Level:  Minimal  Participation Quality:  Attentive  Affect:  Flat  Cognitive:  Alert  Insight:  None shared  Engagement in Group:  Limited  Engagement in Therapy:  None evident  Modes of Intervention:  Clarification, Orientation and Support  Summary of Progress/Problems:  Patient willingly shared that he is here due to stress of living in remote area with limited supports and thoughts of hurting himself.  Patient was quietly attentive to group discussion on emotional regulation.   Jesse Stafford 10/10/2011, 4:21 PM  [facility] Group Notes:  (Counselor/Nursing/MHT/Case Management/Adjunct)  10/10/2011 4:21 PM  Type of Therapy:  Group Therapy at 1:15  Participation Level:  Minimal  Participation Quality:  Attentive OR drowsy   Affect:  Depressed  Cognitive:  Oriented  Insight:  None shared  Engagement in Group:  Limited  Engagement in Therapy:  None noted  Modes of Intervention:  Clarification and Support  Summary of Progress/Problems:  Homero Fellers was somewhat attentive for first portion of group before becoming drowsy.  He did share that when he is dealing with difficult emotions one of the first things he notices is that his body becomes Warmer than normal, even hot in response to the stressors.    Jesse Stafford 10/10/2011, 4:27 PM

## 2011-10-10 NOTE — ED Notes (Signed)
sheriff's dept into ed to take patient to behavioral health. Belongings and medications given to sheriff.

## 2011-10-10 NOTE — [facility] Suicide Risk Assessment (Signed)
Suicide Risk Assessment  Admission Assessment     Demographic factors:  Assessment Details Time of Assessment: Admission Information Obtained From: Patient Current Mental Status:    Loss Factors:  Loss Factors: Decline in physical health;Financial problems / change in socioeconomic status Historical Factors:  Historical Factors: Prior suicide attempts;Family history of mental illness or substance abuse;Domestic violence in family of origin Risk Reduction Factors:  Risk Reduction Factors: Sense of responsibility to family;Living with another person, especially a relative;Positive social support  CLINICAL FACTORS:   Depression:   Anhedonia Comorbid alcohol abuse/dependence Insomnia Alcohol/Substance Abuse/Dependencies More than one psychiatric diagnosis Previous Psychiatric Diagnoses and Treatments Medical Diagnoses and Treatments/Surgeries  COGNITIVE FEATURES THAT CONTRIBUTE TO RISK:  None Noted.    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  Alcohol Abuse.  The patient was seen today and reports the following:   ADL's: Intact.  Sleep: The patient reports to having some difficulty initiating and maintaining sleep.  Appetite: The patient reports a good appetite today.   Mild>(1-10) >Severe  Hopelessness (1-10): 5  Depression (1-10): 10  Anxiety (1-10):  2   Suicidal Ideation: The patient denies any current suicidal ideations today.  Plan: No  Intent: No  Means: No   Homicidal Ideation: The patient adamantly denies any homicidal ideations today.  Plan: No  Intent: No.  Means: No   General Appearance/Behavior: The patient was friendly and was cooperative today with this provider.  Eye Contact: Good.  Speech: Appropriate in rate and volume with no pressuring of speech noted today.  Motor Behavior: wnl.  Level of Consciousness: Alert and Oriented x 3.  Mental Status: Alert and Oriented x 3.  Mood: Severely Depressed.  Affect: Moderately  Constricted.  Anxiety Level: Mild anxiety reported today.  Thought Process: wnl.  Thought Content: The patient denies any auditory or visual hallucinations today as well as any delusional thinking.  Perception:. wnl.  Judgment: Fair.  Insight: Fair.  Cognition: Oriented to person, place and time.   Review of Systems:  Neurological: No headaches, seizures or dizziness reported.  G.I.: The patient denies any constipation or stomach upset.  Musculoskeletal: The patient denies any muscle or skeletal pain today.  Time was spent today discussing with the patient his current symptoms.  The patient states that he is having some difficulty initiating and maintaining sleep.  He reports a good appetite and reports severe feelings of sadness, anhedonia and depressed mood.  He denies any current SI/HI but reports to having thoughts of stepping in front of a train prior to admission.  He denies any auditory or visual hallucinations or delusional thinking and reports mild symptoms of anxiety.  The patient reports to drinking 12 beers per week and drank 6 beers just prior to admission.  He presents for evaluation and treatment of the above symptoms.  Treatment Plan Summary:  1. Daily contact with patient to assess and evaluate symptoms and progress in treatment.  2. Medication management  3. The patient will deny suicidal ideations or homicidal ideations for 48 hours prior to discharge and have a depression and anxiety rating of 3 or less. The patient will also deny any auditory or visual hallucinations or delusional thinking.  4. The patient will deny any symptoms of substance withdrawal at time of discharge.   Plan:  1. Will restart the medication Celexa 20 mgs po q am for depression. 2. Will continue the patient on his non-psychiatric medications. 3. Laboratory studies reviewed.  4. Will continue  to monitor. 5. Will remove the patient's IVC and allow him to sign for voluntary care.  SUICIDE RISK:    Mild:  Suicidal ideation of limited frequency, intensity, duration, and specificity.  There are no identifiable plans, no associated intent, mild dysphoria and related symptoms, good self-control (both objective and subjective assessment), few other risk factors, and identifiable protective factors, including available and accessible social support.  Arpita Fentress 10/10/2011, 2:57 PM
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
## 2011-10-10 NOTE — Discharge Planning (Signed)
New patient attended AM group, good participation.  Stated he is here due to depression, thoughts of suicide.  Stressors of finances, stroke, mother's death in the last 6 mos.  Admits to drinking beer, but denied it is a problem. "Not like liquor that I drank when I was younger.  That's why my marriage broke up."  Apparently when mother was still living, he was helping care for her and she was supporting him.  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  No work in last 4 years or so.

## 2011-10-10 NOTE — ED Notes (Signed)
Resting sitting up on side of bed. No distress. Equal chest rise and fall, regular, unlabored. Call bell within reach. Sitter at bedside.
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
## 2011-10-10 NOTE — Tx Team (Signed)
Initial Interdisciplinary Treatment Plan  PATIENT STRENGTHS: (choose at least two) Average or above average intelligence Capable of independent living Communication skills General fund of knowledge Physical Health  PATIENT STRESSORS: Financial difficulties Marital or family conflict Substance abuse   PROBLEM LIST: Problem List/Patient Goals Date to be addressed Date deferred Reason deferred Estimated date of resolution  "Depression is one good thing" 10/10/11     "Keep stupid thoughts out my head" 10/10/11                 Depression 10/10/11     Suicidal Ideation 10/10/11     Alcohol Abuse 10/10/11                  DISCHARGE CRITERIA:  Ability to meet basic life and health needs Adequate post-discharge living arrangements Improved stabilization in mood, thinking, and/or behavior Medical problems require only outpatient monitoring Motivation to continue treatment in a less acute level of care Need for constant or close observation no longer present Reduction of life-threatening or endangering symptoms to within safe limits Safe-care adequate arrangements made Verbal commitment to aftercare and medication compliance Withdrawal symptoms are absent or subacute and managed without 24-hour nursing intervention  PRELIMINARY DISCHARGE PLAN: Attend aftercare/continuing care group Attend 12-step recovery group Outpatient therapy Participate in family therapy Return to previous living arrangement  PATIENT/FAMIILY INVOLVEMENT: This treatment plan has been presented to and reviewed with the patient, Jesse Stafford, and/or family member.  The patient and family have been given the opportunity to ask questions and make suggestions.  Fransico Michael [facility] 10/10/2011, 5:34 AM

## 2011-10-11 NOTE — Discharge Planning (Signed)
Jesse Stafford attended AM group.  Looks much better.  Says he feels better.  Says he will return home via sheriff.  Focused on getting disability as the doctor has told him he is unable to work.  Admits he did not follow through with PT after stroke.  Dr will order consult here to see if he is still in need of that service, and, if so, find out if we can set him up for services at d/c.

## 2011-10-11 NOTE — Progress Notes (Signed)
In dayroom talking with peers on approach. Has been interacting well with peers. Calm and cooperative with assessment. No acute distress noted. States he feels like he is doing well and had a good day. States he has enjoyed groups and hearing other peoples perspectives. States it has helped him to frame his issues and given him some insight he didn't have. Discussed coming here and was almost dismissive as he feels like he has been depressed for sometime and really doesn't feel any different in that regard. Support and encouragement provided. Did go on to point that he no longer felt suicidal and he is glad that he came. Offered no questions or concerns. Denies SI/HI/AVH and contracts for safety. POC and medications for the shift reviewed and understanding verbalized. Safety has been maintained with Q15 minute observation. Will continue current POC.

## 2011-10-11 NOTE — Progress Notes (Signed)
[facility] Group Notes:  (Counselor/Nursing/MHT/Case Management/Adjunct)  10/11/2011 7:26 PM  Type of Therapy:  Group Therapy  Participation Level:  Minimal  Participation Quality:  Attentive and hesitant  Affect:  Depressed  Cognitive:  Alert and Oriented  Insight:  Limited  Engagement in Group:  Limited  Engagement in Therapy:  Unknown  Modes of Intervention:  Socialization and Support  Summary of Progress/Problems:  Patient attentive yet not verbally contributive to discussion on balance in life,    Jesse Stafford 10/11/2011, 7:28 PM  [facility] Group Notes:  (Counselor/Nursing/MHT/Case Management/Adjunct)  10/11/2011 7:28 PM  Type of Therapy:  Group Therapy  Participation Level:  Active  Participation Quality:  Attentive and Sharing  Affect:  Depressed  Cognitive:  Alert and Oriented  Insight:  Limited  Engagement in Group:  Good  Engagement in Therapy:  Limited  Modes of Intervention:  Activity, Socialization and Support  Summary of Progress/Problems: Jesse Stafford participated in activity in which patients choose photographs to represent what their life would look and feel like were it in balance and another for out of balance. Jesse Stafford chose a photo of a country road and shared how he loved to ride his motorcycle out on spring days and feel the wind and know the road was smooth.  He also chose a photo of a chain length fence with barbed wire to represent "lack of freedom, like here, I hate being kept locked up."  When asked where exactly in his life patient does have freedom patient shared "nowwehjre really, as I don't have transportation, money, vision or hope"   Jesse Stafford 10/11/2011, 7:40 PM

## 2011-10-11 NOTE — Progress Notes (Signed)
10/11/2011         Time: 1415      Group Topic/Focus: The focus of this group is on enhancing patients' problem solving skills, which involves identifying the problem, brainstorming solutions and choosing and trying a solution.   Participation Level: Active  Participation Quality: Appropriate and Attentive  Affect: Appropriate  Cognitive: Oriented   Additional Comments: None.   Shaka Cardin 10/11/2011 3:12 PM 

## 2011-10-11 NOTE — Progress Notes (Signed)
Pt rates depression as a 6 on a 1-10 scale with 10 being the most depressed. Pt's affect is blunted and depressed. Offered support and 15 minute checks. Pt denies si and hi. Safety maintained on unit.
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
## 2011-10-11 NOTE — Progress Notes (Signed)
Currently resting quietly in bed with eyes closed. Respirations are even and unlabored. No acute distress noted. Safety has been maintained with Q15 minute observation. Will continue current POC. 

## 2011-10-11 NOTE — Treatment Plan (Signed)
Interdisciplinary Treatment Plan Update (Adult)  Date: 10/11/2011  Time Reviewed: 10:00 AM   Progress in Treatment: Attending groups: Yes Participating in groups: Yes Taking medication as prescribed: Yes Tolerating medication: Yes   Family/Significant other contact made:  No Patient understands diagnosis:  Yes Discussing patient identified problems/goals with staff:  Yes  As evidenced by asking for help with depression, alcohol use Medical problems stabilized or resolved:  Yes Denies suicidal/homicidal ideation: Yes  In tx team Issues/concerns per patient self-inventory:  Not filled out   Other:  New problem(s) identified: N/A  Reason for Continuation of Hospitalization: Depression Medication stabilization Withdrawal symptoms  Interventions implemented related to continuation of hospitalization: Librium taper, Medication stabilization, encourage group attendance and participation  Additional comments:  Estimated length of stay:2-3 days  Discharge Plan:  Return home, follow up outpt  New goal(s): N/A  Review of initial/current patient goals per problem list:   1.  Goal(s): Safely detox from alcohol  Met:  No  Target date:5/10  As evidenced by: Stable vitals, CIWA of 0  2.  Goal (s): Identify comprehensive mental health and sobriety plan  Met:  No  Target date:5/10  As evidenced by: Self report  3.  Goal(s):Eliminate SI  Met:  Yes  Target date: Homero Fellers denies SI in tx team  As evidenced by:  4.  Goal(s): Decrease depression  Met:  No  Target date:5/10  As evidenced WG:NFAOZ will rate his depression at 3 or less on self inventory  Attendees: Patient: Jesse Stafford  10/11/2011 10:00 AM  Family:     Physician:  Lupe Carney 10/11/2011 10:00 AM   Nursing:  Roswell Miners  10/11/2011 10:00 AM   Case Manager:  Richelle Ito, LCSW 10/11/2011 10:00 AM   Counselor:  Ronda Fairly, LCSWA 10/11/2011 10:00 AM   Other:     Other:     Other:     Other:       Scribe for Treatment Team:   Daryel Gerald B, 10/11/2011 10:00 AM

## 2011-10-11 NOTE — Progress Notes (Addendum)
[facility] MD Progress Note  10/11/2011 10:47 AM  S/O: Suicidal Ideation: The patient denies any current suicidal ideations today.  Plan: No  Intent: No  Means: No  Homicidal Ideation: The patient denies any homicidal ideations today.  Plan: No  Intent: No.  Means: No  General Appearance/Behavior: The patient was friendly and was cooperative today with this provider and Tx team. Eye Contact: Good.  Speech: Appropriate in rate and volume with no pressuring of speech noted today.  Motor Behavior: wnl.  Level of Consciousness: Alert and Oriented x 3.  Mood: "ok"  Affect: Moderately Constricted.  Anxiety Level: Mild anxiety reported today.  Thought Process: wnl.  Thought Content: The patient denies any auditory or visual hallucinations today as well as any delusional thinking.  Perception:. wnl.  Judgment: Fair.  Insight: Fair.   Sleep:  Number of Hours: 6.75    Vital Signs:Blood pressure 168/91, pulse 71, temperature 97.9 F (36.6 C), temperature source Oral, resp. rate 16, height 5' 4.75" (1.645 m), weight 72.122 kg (159 lb).  Lab Results:  Results for orders placed during the hospital encounter of 10/09/11 (from the past 48 hour(s))  CBC     Status: Normal   Collection Time   10/09/11  8:37 PM      Component Value Range Comment   WBC 9.0  4.0 - 10.5 (K/uL)    RBC 5.34  4.22 - 5.81 (MIL/uL)    Hemoglobin 17.0  13.0 - 17.0 (g/dL)    HCT 16.1  09.6 - 04.5 (%)    MCV 90.8  78.0 - 100.0 (fL)    MCH 31.8  26.0 - 34.0 (pg)    MCHC 35.1  30.0 - 36.0 (g/dL)    RDW 40.9  81.1 - 91.4 (%)    Platelets 187  150 - 400 (K/uL)   COMPREHENSIVE METABOLIC PANEL     Status: Abnormal   Collection Time   10/09/11  8:37 PM      Component Value Range Comment   Sodium 140  135 - 145 (mEq/L)    Potassium 4.2  3.5 - 5.1 (mEq/L)    Chloride 107  96 - 112 (mEq/L)    CO2 22  19 - 32 (mEq/L)    Glucose, Bld 115 (*) 70 - 99 (mg/dL)    BUN 7  6 - 23 (mg/dL)    Creatinine, Ser 7.82  0.50 - 1.35 (mg/dL)    Calcium 9.5  8.4 - 10.5 (mg/dL)    Total Protein 8.7 (*) 6.0 - 8.3 (g/dL)    Albumin 4.1  3.5 - 5.2 (g/dL)    AST 61 (*) 0 - 37 (U/L)    ALT 61 (*) 0 - 53 (U/L)    Alkaline Phosphatase 83  39 - 117 (U/L)    Total Bilirubin 0.3  0.3 - 1.2 (mg/dL)    GFR calc non Af Amer >90  >90 (mL/min)    GFR calc Af Amer >90  >90 (mL/min)   ETHANOL     Status: Abnormal   Collection Time   10/09/11  8:37 PM      Component Value Range Comment   Alcohol, Ethyl (B) 185 (*) 0 - 11 (mg/dL)   URINE RAPID DRUG SCREEN (HOSP PERFORMED)     Status: Normal   Collection Time   10/09/11  8:48 PM      Component Value Range Comment   Opiates NONE DETECTED  NONE DETECTED     Cocaine NONE DETECTED  NONE DETECTED  Benzodiazepines NONE DETECTED  NONE DETECTED     Amphetamines NONE DETECTED  NONE DETECTED     Tetrahydrocannabinol NONE DETECTED  NONE DETECTED     Barbiturates NONE DETECTED  NONE DETECTED    DIFFERENTIAL     Status: Normal   Collection Time   10/09/11  8:59 PM      Component Value Range Comment   Neutrophils Relative 54  43 - 77 (%)    Neutro Abs 4.6  1.7 - 7.7 (K/uL)    Lymphocytes Relative 36  12 - 46 (%)    Lymphs Abs 3.2  0.7 - 4.0 (K/uL)    Monocytes Relative 7  3 - 12 (%)    Monocytes Absolute 0.6  0.1 - 1.0 (K/uL)    Eosinophils Relative 2  0 - 5 (%)    Eosinophils Absolute 0.2  0.0 - 0.7 (K/uL)    Basophils Relative 1  0 - 1 (%)    Basophils Absolute 0.1  0.0 - 0.1 (K/uL)   URINALYSIS, ROUTINE W REFLEX MICROSCOPIC     Status: Abnormal   Collection Time   10/09/11  8:59 PM      Component Value Range Comment   Color, Urine YELLOW  YELLOW     APPearance CLEAR  CLEAR     Specific Gravity, Urine <1.005 (*) 1.005 - 1.030     pH 5.5  5.0 - 8.0     Glucose, UA NEGATIVE  NEGATIVE (mg/dL)    Hgb urine dipstick SMALL (*) NEGATIVE     Bilirubin Urine NEGATIVE  NEGATIVE     Ketones, ur NEGATIVE  NEGATIVE (mg/dL)    Protein, ur NEGATIVE  NEGATIVE (mg/dL)    Urobilinogen, UA 0.2  0.0 - 1.0 (mg/dL)     Nitrite NEGATIVE  NEGATIVE     Leukocytes, UA NEGATIVE  NEGATIVE    PROTIME-INR     Status: Normal   Collection Time   10/09/11  8:59 PM      Component Value Range Comment   Prothrombin Time 14.5  11.6 - 15.2 (seconds)    INR 1.11  0.00 - 1.49    APTT     Status: Normal   Collection Time   10/09/11  8:59 PM      Component Value Range Comment   aPTT 37  24 - 37 (seconds)   ACETAMINOPHEN LEVEL     Status: Normal   Collection Time   10/09/11  8:59 PM      Component Value Range Comment   Acetaminophen (Tylenol), Serum <15.0  10 - 30 (ug/mL)   SALICYLATE LEVEL     Status: Abnormal   Collection Time   10/09/11  8:59 PM      Component Value Range Comment   Salicylate Lvl <2.0 (*) 2.8 - 20.0 (mg/dL)   TRICYCLICS SCREEN, URINE     Status: Normal   Collection Time   10/09/11  8:59 PM      Component Value Range Comment   TCA Scrn NONE DETECTED  NONE DETECTED    URINE MICROSCOPIC-ADD ON     Status: Normal   Collection Time   10/09/11  8:59 PM      Component Value Range Comment   Squamous Epithelial / LPF RARE  RARE     WBC, UA 0-2  <3 (WBC/hpf)    RBC / HPF 0-2  <3 (RBC/hpf)    Bacteria, UA RARE  RARE      Physical Findings: CIWA:  CIWA-Ar  Total: 1   A/P:  AD with mixed depressive and anxiety s/s; Alcohol Abuse   1. Will continue Celexa 20 mg po q am that was initiated yesterday.  No SEs reported.  2. Will continue the patient on his non-psychiatric medications and other meds as noted on chart. 3. Laboratory studies reviewed.  4. Will continue to monitor.  5. Pt exploring potential residential Tx. 6. Placing PT consult as well in light of residential R sided weakness s/p CVA with no PT f/u as instructed.  Discussed with team.  Lupe Carney 10/11/2011, 10:47 AM

## 2011-10-12 DIAGNOSIS — R45851 Suicidal ideations: Secondary | ICD-10-CM

## 2011-10-12 DIAGNOSIS — F4323 Adjustment disorder with mixed anxiety and depressed mood: Principal | ICD-10-CM

## 2011-10-12 NOTE — Progress Notes (Signed)
[facility] Group Notes:  (Counselor/Nursing/MHT/Case Management/Adjunct)  10/12/2011 4:30 PM  Type of Therapy:  Group Therapy  Participation Level:  Active  Participation Quality:  Appropriate, Attentive and Sharing  Affect:  Appropriate  Cognitive:  Appropriate  Insight:  Improving  Engagement in Group:  Good  Modes of Intervention:  Clarification, Education and Support  Summary of Progress/Problems: Homero Fellers was attentive to discussion on Post Acute Withdrawal Syndrome (PAWS).  He shared that he believed he had been through some of the symptoms before.    Clide Dales 10/12/2011, 4:37 PM  [facility] Group Notes:  (Counselor/Nursing/MHT/Case Management/Adjunct)  10/12/2011 4:38 PM  Type of Therapy:  Group Therapy  Participation Level:  Active  Participation Quality:  Attentive and Sharing  Affect:  Appropriate  Cognitive:  Alert and Oriented  Insight:  Improving  Engagement in Group:  Good  Engagement in Therapy:  Good  Modes of Intervention:  Clarification, Socialization and Support  Summary of Progress/Problems:  Patient shared in discussion about relapse and feelings surrounding possible relapse.  Homero Fellers shared his fear of returning home and going back to his usual habits.  Others in group offered support and problem solving.    Clide Dales 10/12/2011, 4:47 PM

## 2011-10-12 NOTE — Progress Notes (Signed)
Pt's affect is blunted and depressed. Pt reports that he lives by himself and that his brother sometimes stays with him. He talked about having depression for years and is often isolative. Offered support and encouragement. Talked with pt about support groups outside of hospital. Pt receptive and states that he visits a church once a months that is near his home. Safety maintained on unit.

## 2011-10-12 NOTE — Progress Notes (Signed)
[facility] MD Progress Note  10/12/2011 4:14 PM  S/O: Suicidal Ideation: The patient denies any current suicidal ideations today.  Plan: No  Intent: No  Means: No  Homicidal Ideation: The patient denies any homicidal ideations today.  Plan: No  Intent: No.  Means: No  General Appearance/Behavior: The patient was friendly and was cooperative today with this provider. Eye Contact: Good.  Speech: Appropriate in rate and volume with no pressuring of speech noted today.  Motor Behavior: wnl.  Level of Consciousness: Alert and Oriented x 3.  Mood: "ok"  Affect: Moderately Constricted.  Anxiety Level: Mild anxiety reported today.  Thought Process: wnl.  Thought Content: The patient denies any auditory or visual hallucinations today as well as any delusional thinking.  Perception:. wnl.  Judgment: Fair.  Insight: Fair.   Sleep:  Number of Hours: 6.5    Vital Signs:Blood pressure 130/92, pulse 67, temperature 98.9 F (37.2 C), temperature source Oral, resp. rate 16, height 5' 4.75" (1.645 m), weight 72.122 kg (159 lb).  Lab Results:  No results found for this or any previous visit (from the past 48 hour(s)).  Physical Findings: CIWA:  CIWA-Ar Total: 1   A/P:  AD with mixed depressive and anxiety s/s; Alcohol Abuse   1. Will continue Celexa 20 mg po q am that was initiated on 10/10/11.  No SEs reported.  2. Will continue the patient on his non-psychiatric medications and other meds as noted on chart. 3. Laboratory studies reviewed.  4. Will continue to monitor.  5. Pt exploring potential residential Tx. 6. PT consult results pending in light of residential R sided weakness s/p CVA with no PT f/u as instructed.  Discussed with team. 7. Discharge tomorrow once PT consult is completed and disposition is clarified.  Lupe Carney 10/12/2011, 4:14 PM

## 2011-10-12 NOTE — Progress Notes (Signed)
Patient ID: Jesse Stafford, male   DOB: 03-Jul-1955, 56 y.o.   MRN: [id number]   Patient appears to be sleeping. Respirations even and non-labored. Staff will monitor q 15 minutes.

## 2011-10-12 NOTE — Tx Team (Signed)
Interdisciplinary Treatment Plan Update (Adult)  Date: 10/12/2011 Time Reviewed: 1100  Progress in Treatment: Attending groups: Yes Participating in groups:  Yes Taking medication as prescribed: Yes Tolerating medication:  Yes Family/Significant other contact made: Yes, two attempts with wife so far. No direct phone conversation at this point.   Patient understands diagnosis:  Yes Discussing patient identified problems/goals with staff:  Yes Medical problems stabilized or resolved:  No, patient experiencing severe withdrawal. Denies suicidal/homicidal ideation: Yes Issues/concerns per patient self-inventory:  None identified Other: N/A  New problem(s) identified: None Identified  Reason for Continuation of Hospitalization: Depression Withdrawal symptoms  Interventions implemented related to continuation of hospitalization: mood stabilization, medication monitoring and adjustment, group therapy and psycho education, safety checks q 15 mins  Additional comments: N/A  Estimated length of stay: 3-5 days  Discharge Plan: Patient will return home at discharge and will follow up at [facility].  New goal(s): N/A  Review of initial/current patient goals per problem list:    1.  Goal(s): Address substance use  Met:  No  Target date: by discharge  As evidenced by: completing detox protocol and refer to appropriate treatment  2.  Goal (s): Reduce depressive symptoms  Met:  No  Target date: by discharge  As evidenced by: Reducing depression from a 10 to a 3 as reported by pt.     Attendees: Patient:  Jesse Stafford 10/12/2011 11:54 AM   Family:     Physician:  Lupe Carney, DO 10/12/2011 11:54 AM  Nursing: Waynetta Sandy RN 10/12/2011 11:54 AM    Case Manager:  Barrie Folk, RN 10/12/2011 11:54 AM  Counselor:  Ronda Fairly, LCSWA 10/12/2011 11:54 AM  Other:    Other:     Other:     Other:      Scribe for Treatment Team:   Barrie Folk 10/12/2011 11:54  AM

## 2011-10-12 NOTE — Progress Notes (Addendum)
[facility] Adult Inpatient Family/Significant Other Suicide Prevention Education  Suicide Prevention Education:  Patient Refusal for Family/Significant Other Suicide Prevention Education: The patient Jesse Stafford has provided written consent for family/significant other to be provided Family/Significant Other Suicide Prevention Education during admission and/or prior to discharge. Neither party was reached over two business days.  Attempts were made to contact both patient's nephew, Rolfe Hartsell at [phone] and cousin Steward Drone at [phone].  Physician notified.  Date and time of first attempts:  10/11/2011 12:36 PM  and   5:35 Date and time of second attempts: 10/12/2011 4:25 PM   Writer provided suicide prevention education directly to patient; conversation included risk factors, warning signs and resources to contact for help. Mobile crisis services explained and contact card placed in chart for pt to receive at discharge.   Clide Dales 10/12/2011, 4:48 PM

## 2011-10-12 NOTE — Evaluation (Signed)
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  Pt instructed in left balance exercise to practice on his own.  He does not need further PT or an assistive devide    PT Assessment  Patent does not need any further PT services    Follow Up Recommendations  No PT follow up    Barriers to Discharge        lEquipment Recommendations  None recommended by PT    Recommendations for Other Services     Frequency      Precautions / Restrictions Restrictions Weight Bearing Restrictions: No   Pertinent Vitals/Pain Some c/o pain in left lateral toes      Mobility  Transfers Transfers: Sit to Stand;Stand to Sit Sit to Stand: 7: Independent Stand to Sit: 7: Independent Ambulation/Gait Ambulation/Gait Assistance: 7: Independent Ambulation Distance (Feet): 200 Feet Assistive device: None Ambulation/Gait Assistance Details: pt shows no deficits on ambulation on level surfaces.  Minor difficulty with braiding Gait Pattern: Within Functional Limits Gait velocity: WFL Stairs: No Wheelchair Mobility Wheelchair Mobility: No    Exercises Other Exercises Other Exercises: on legged stance on left leg with trunk in erect position to strenghten hip abduction Other Exercises: repeated sit to stand   PT Diagnosis:    PT Problem List:   PT Treatment Interventions:     PT Goals    Visit Information  Last PT Received On: 10/12/11 Assistance Needed: +1    Subjective Data  Subjective: "I have trouble when I walk outside in the yard" Patient Stated Goal: to walk OK all the time   Prior Functioning  Prior Function Level of Independence: Independent Able to Take Stairs?:  Yes Communication Communication: No difficulties Dominant Hand: Right    Cognition  Overall Cognitive Status: Appears within functional limits for tasks assessed/performed Arousal/Alertness: Awake/alert Orientation Level: Appears intact for tasks assessed Behavior During Session: [facility] for tasks performed    Extremity/Trunk Assessment Right Upper Extremity Assessment RUE ROM/Strength/Tone: [facility] for tasks assessed (pt reports he still has occasional problems with right hand) Left Upper Extremity Assessment LUE ROM/Strength/Tone: WFL for tasks assessed Right Lower Extremity Assessment RLE ROM/Strength/Tone: [facility] for tasks assessed (pt with some probems with very high level tasks) RLE Sensation: Deficits RLE Sensation Deficits: proprioception defecits with high level activities RLE Coordination: Deficits RLE Coordination Deficits: some difficulty with high level gait and braiding Left Lower Extremity Assessment LLE ROM/Strength/Tone: WFL for tasks assessed Trunk Assessment Trunk Assessment: Normal   Balance Balance Balance Assessed: Yes Dynamic Sitting Balance Dynamic Sitting - Balance Support: No upper extremity supported;Feet supported Dynamic Sitting - Level of Assistance: 7: Independent Standardized Balance Assessment Standardized Balance Assessment: Berg Balance Test Berg Balance Test Sit to Stand: Able to stand without using hands and stabilize independently Standing Unsupported: Able to stand safely 2 minutes Sitting with Back Unsupported but Feet Supported on Floor or Stool: Able to sit safely and securely 2 minutes Stand to Sit: Sits safely with minimal use of hands Transfers: Able to transfer safely, minor use of hands Standing Unsupported with Eyes Closed: Able to stand 10 seconds safely Standing Ubsupported with Feet Together: Able to place feet together independently and stand 1 minute safely From Standing, Reach Forward  with Outstretched Arm: Can reach confidently >25  cm (10") From Standing Position, Pick up Object from Floor: Able to pick up shoe safely and easily From Standing Position, Turn to Look Behind Over each Shoulder: Looks behind from both sides and weight shifts well Turn 360 Degrees: Able to turn 360 degrees safely in 4 seconds or less Standing Unsupported, Alternately Place Feet on Step/Stool: Able to stand independently and safely and complete 8 steps in 20 seconds Standing Unsupported, One Foot in Front: Able to place foot tandem independently and hold 30 seconds Standing on One Leg: Able to lift leg independently and hold equal to or more than 3 seconds Total Score: 54  High Level Balance High Level Balance Activites: Braiding;Direction changes High Level Balance Comments: pt with some difficulty with braiding  End of Session PT - End of Session Activity Tolerance: Patient tolerated treatment well Patient left: Other (comment) (walking independently in hallway) Nurse Communication: Mobility status   Donnetta Hail 10/12/2011, 2:13 PM

## 2011-10-13 DIAGNOSIS — F339 Major depressive disorder, recurrent, unspecified: Secondary | ICD-10-CM

## 2011-10-13 NOTE — Progress Notes (Signed)
Patient ID: Jesse Stafford, male   DOB: [birth year], 56 y.o.   MRN: [id number] Pt. attended and participated in aftercare planning group. Pt. accepted information on suicide prevention, warning signs to look for with suicide and crisis line numbers to use. The pt. agreed to call crisis line numbers if having warning signs or having thoughts of suicide. Pt. listed their current anxiety level as 3 and depression as a 3 on a scale of 1 to 10 with 10 as the high.  Pt. accepted and NA meeting schedule.

## 2011-10-13 NOTE — Progress Notes (Signed)
Pt presents calm and cooperative this evening. Pt forwards a little with this Clinical research associate. Pt denies having any psychological symptoms at this time. Pt is anticipating discharge. Pt attends groups and spends an appropriate amount of time outside of room. Continued support and availability as needed has been extended to this pt. Pt safety remains with q75min checks.

## 2011-10-13 NOTE — Progress Notes (Signed)
Patient ID: Jesse Stafford, male   DOB: 1956-02-25, 56 y.o.   MRN: [id number] 10-13-11 nursing shift note; pt has required minimal care today. He has refused his anusol today. On his inventory sheet he wrote he slept fair, appetite good, energy normal, attention good with depression and hopelessness at 3.  W/d symptoms have been cravings. Denied any si or self harm. Denied any physical problems. Pain was rated at 1 but he has shown no s/s of distress and has not ask for any meds to address any pain. After discharge he plans to take better care of himself. He stated he doesn't see any problem staying on his meds after discharge. rn will monitor and q 15 min cks continue.

## 2011-10-13 NOTE — Progress Notes (Signed)
[facility] MD Progress Note  10/13/2011 2:49 PM  Diagnosis:   Axis I: Alcohol Abuse and Major Depression, Recurrent severe Axis II: Deferred Axis III:  Past Medical History  Diagnosis Date  . Chest pain 2007    Associated with syncope in 2011  . Hypertension     Echo in 2011-mild LVH, hyperdynamic LV function, mild to moderate AI  . Alcohol abuse   . Tobacco abuse     20 pack years; 1/2 pack per day  . Thrombocytopenia     possible alcohol toxicity; 122,000  . Chronic sinusitis     By CT  . Chest pain   . Stroke   . Depression    Subjective: Jesse Stafford he reports that he is doing well overall. He denies any withdrawal or cravings. He denies any suicidal or homicidal ideation. He denies any auditory or visual hallucinations. He reports that he is sleeping probably too much. His appetite is good.  ADL's:  Intact  Sleep: Good  Appetite:  Good  Suicidal Ideation:  Patient denies any thought, plan, intent Homicidal Ideation:  Patient denies any thoughts, plans, intent  AEB (as evidenced by):  Mental Status Examination/Evaluation: Objective:  Appearance: Casual and Fairly Groomed  Eye Contact::  Fair  Speech:  Clear and Coherent  Volume:  Normal  Mood:  Dysphoric  Affect:  Blunt  Thought Process:  Logical  Orientation:  Full  Thought Content:  WDL  Suicidal Thoughts:  No  Homicidal Thoughts:  No  Memory:  Immediate;   Good  Judgement:  Fair  Insight:  Fair  Psychomotor Activity:  Normal  Concentration:  Good  Recall:  Good  Akathisia:  No  Handed:    AIMS (if indicated):     Assets:    Sleep:  Number of Hours: 6.75    Vital Signs:Blood pressure 130/84, pulse 64, temperature 97.6 F (36.4 C), temperature source Oral, resp. rate 16, height 5' 4.75" (1.645 m), weight 72.122 kg (159 lb). Current Medications: Current Facility-Administered Medications  Medication Dose Route Frequency Provider Last Rate Last Dose  . acetaminophen (TYLENOL) tablet 650 mg  650 mg Oral Q6H PRN  Viviann Spare, NP      . alum & mag hydroxide-simeth (MAALOX/MYLANTA) 200-200-20 MG/5ML suspension 30 mL  30 mL Oral Q4H PRN Viviann Spare, NP      . amLODipine (NORVASC) tablet 10 mg  10 mg Oral Daily Curlene Labrum Readling, MD   10 mg at 10/13/11 0811  . aspirin chewable tablet 81 mg  81 mg Oral Daily Curlene Labrum Readling, MD   81 mg at 10/13/11 0811  . atenolol (TENORMIN) tablet 25 mg  25 mg Oral BH-q7a Curlene Labrum Readling, MD   25 mg at 10/13/11 0640  . chlordiazePOXIDE (LIBRIUM) capsule 25 mg  25 mg Oral Q6H PRN Viviann Spare, NP   25 mg at 10/10/11 1516  . chlordiazePOXIDE (LIBRIUM) capsule 25 mg  25 mg Oral TID Curlene Labrum Readling, MD   25 mg at 10/12/11 1709   Followed by  . chlordiazePOXIDE (LIBRIUM) capsule 25 mg  25 mg Oral BH-qamhs Curlene Labrum Readling, MD   25 mg at 10/13/11 1610   Followed by  . chlordiazePOXIDE (LIBRIUM) capsule 25 mg  25 mg Oral Daily Curlene Labrum Readling, MD      . citalopram (CELEXA) tablet 20 mg  20 mg Oral Daily Curlene Labrum Readling, MD   20 mg at 10/13/11 0811  . hydrocortisone (ANUSOL-HC) 2.5 % rectal  cream   Rectal BID Curlene Labrum Readling, MD      . hydrOXYzine (ATARAX/VISTARIL) tablet 25 mg  25 mg Oral Q6H PRN Viviann Spare, NP      . loperamide (IMODIUM) capsule 2-4 mg  2-4 mg Oral PRN Viviann Spare, NP      . magnesium hydroxide (MILK OF MAGNESIA) suspension 30 mL  30 mL Oral Daily PRN Viviann Spare, NP      . mulitivitamin with minerals tablet 1 tablet  1 tablet Oral Daily Ronny Bacon, MD   1 tablet at 10/13/11 0811  . nicotine (NICODERM CQ - dosed in mg/24 hours) patch 21 mg  21 mg Transdermal Q0600 Curlene Labrum Readling, MD   21 mg at 10/13/11 0600  . ondansetron (ZOFRAN-ODT) disintegrating tablet 4 mg  4 mg Oral Q6H PRN Viviann Spare, NP      . thiamine (VITAMIN B-1) tablet 100 mg  100 mg Oral Daily Curlene Labrum Readling, MD   100 mg at 10/13/11 1610    Lab Results: No results found for this or any previous visit (from the past 48 hour(s)).  Physical  Findings: AIMS:  , ,  ,  ,    CIWA:  CIWA-Ar Total: 1  COWS:     Treatment Plan Summary: Daily contact with patient to assess and evaluate symptoms and progress in treatment Medication management  Plan: We will complete a safe medical detox. Continue his Celexa at 20 mg daily, he will followup at [facility] in [facility].  Apryll Hinkle 10/13/2011, 2:49 PM

## 2011-10-13 NOTE — Progress Notes (Signed)
Patient ID: Jesse Stafford, male   DOB: Apr 04, 1956, 56 y.o.   MRN: [id number]  [facility] Group Notes:  (Counselor/Nursing/MHT/Case Management/Adjunct)  10/13/2011 1:15 PM  Type of Therapy:  Group Therapy, Dance/Movement Therapy   Participation Level:  Minimal  Participation Quality:  Appropriate  Affect:  Appropriate  Cognitive:  Appropriate  Insight:  Limited  Engagement in Group:  Limited  Engagement in Therapy:  Limited  Modes of Intervention:  Clarification, Problem-solving, Role-play, Socialization and Support  Summary of Progress/Problems:  Therapist discussed knowing when to set personal boundaries and limits in order to avoid self sabotage.  Group discussed types of fears and anxiety they are coping with that have lead them to alcohol and substance abuse.  Group member stated that when "I feel socially acceptable when I drink alcohol.  I know I need to make a change because it is not good for my health".  Pt. seems to be aware of triggers.  Therapist invited group to write down at least 5 healthy coping mechanisms to utilize when feeling bored.    Rhunette Croft

## 2011-10-13 NOTE — Progress Notes (Signed)
Pt pleasant and appropriate on approach, denies complaints.  Pt unhappy about still being on librium, states no s/s of withdrawal and feels ready for discharge but afraid he can't be discharged if he is still taking the librium.  Denies SI/HI/hallucinations.  Support and encouragement provided, will continue to monitor.

## 2011-10-14 NOTE — Progress Notes (Signed)
Pt up and active in milieu, interacting appropriately.  Pt positive for evening group, no complaints voiced.  Denies SI/HI/hallucinations and any withdrawal symptoms.  Looking forward to discharge.  Support and encouragement offered, will continue to monitor.

## 2011-10-14 NOTE — Progress Notes (Signed)
[facility] MD Progress Note  10/14/2011 1:18 PM  Diagnosis:   Axis I: Alcohol Abuse and Major Depression, Recurrent severe Axis II: Deferred Axis III:  Past Medical History  Diagnosis Date  . Chest pain 2007    Associated with syncope in 2011  . Hypertension     Echo in 2011-mild LVH, hyperdynamic LV function, mild to moderate AI  . Alcohol abuse   . Tobacco abuse     20 pack years; 1/2 pack per day  . Thrombocytopenia     possible alcohol toxicity; 122,000  . Chronic sinusitis     By CT  . Chest pain   . Stroke   . Depression    Subjective: Jesse Stafford he reports that he is doing pretty well today. He denies any withdrawal symptoms, but he does endorse some thoughts of drinking. He has gained an interest in attending AA in plans to find out the where the meetings are close to his home. He denies any suicidal or homicidal ideation. He denies any auditory or visual hallucinations. He endorses that he slept well last night, and his appetite is good.  ADL's:  Intact  Sleep: Good  Appetite:  Good  Suicidal Ideation:  Patient denies any thought, plan, or intent Homicidal Ideation:  Patient denies any thought, plan, or intent  AEB (as evidenced by):  Mental Status Examination/Evaluation: Objective:  Appearance: Casual and Fairly Groomed  Eye Contact::  Good  Speech:  Clear and Coherent  Volume:  Normal  Mood:  Dysphoric  Affect:  Congruent  Thought Process:  Goal Directed and Linear  Orientation:  Full  Thought Content:  WDL  Suicidal Thoughts:  No  Homicidal Thoughts:  No  Memory:  Immediate;   Good  Judgement:  Good  Insight:  Good  Psychomotor Activity:  Normal  Concentration:  Good  Recall:  Good  Akathisia:  No  Handed:    AIMS (if indicated):     Assets:  Desire for Improvement  Sleep:  Number of Hours: 5.25    Vital Signs:Blood pressure 136/85, pulse 70, temperature 98.3 F (36.8 C), temperature source Oral, resp. rate 20, height 5' 4.75" (1.645 m), weight 72.122  kg (159 lb). Current Medications: Current Facility-Administered Medications  Medication Dose Route Frequency Provider Last Rate Last Dose  . acetaminophen (TYLENOL) tablet 650 mg  650 mg Oral Q6H PRN Viviann Spare, NP      . alum & mag hydroxide-simeth (MAALOX/MYLANTA) 200-200-20 MG/5ML suspension 30 mL  30 mL Oral Q4H PRN Viviann Spare, NP      . amLODipine (NORVASC) tablet 10 mg  10 mg Oral Daily Curlene Labrum Readling, MD   10 mg at 10/14/11 0800  . aspirin chewable tablet 81 mg  81 mg Oral Daily Curlene Labrum Readling, MD   81 mg at 10/14/11 0806  . atenolol (TENORMIN) tablet 25 mg  25 mg Oral BH-q7a Curlene Labrum Readling, MD   25 mg at 10/14/11 1610  . chlordiazePOXIDE (LIBRIUM) capsule 25 mg  25 mg Oral Q6H PRN Viviann Spare, NP   25 mg at 10/10/11 1516  . chlordiazePOXIDE (LIBRIUM) capsule 25 mg  25 mg Oral BH-qamhs Randy D Readling, MD   25 mg at 10/13/11 2155   Followed by  . chlordiazePOXIDE (LIBRIUM) capsule 25 mg  25 mg Oral Daily Curlene Labrum Readling, MD   25 mg at 10/14/11 0801  . citalopram (CELEXA) tablet 20 mg  20 mg Oral Daily Ronny Bacon, MD  20 mg at 10/14/11 0800  . hydrocortisone (ANUSOL-HC) 2.5 % rectal cream   Rectal BID Curlene Labrum Readling, MD      . hydrOXYzine (ATARAX/VISTARIL) tablet 25 mg  25 mg Oral Q6H PRN Viviann Spare, NP      . loperamide (IMODIUM) capsule 2-4 mg  2-4 mg Oral PRN Viviann Spare, NP      . magnesium hydroxide (MILK OF MAGNESIA) suspension 30 mL  30 mL Oral Daily PRN Viviann Spare, NP      . mulitivitamin with minerals tablet 1 tablet  1 tablet Oral Daily Curlene Labrum Readling, MD   1 tablet at 10/14/11 0800  . nicotine (NICODERM CQ - dosed in mg/24 hours) patch 21 mg  21 mg Transdermal Q0600 Curlene Labrum Readling, MD   21 mg at 10/14/11 0612  . ondansetron (ZOFRAN-ODT) disintegrating tablet 4 mg  4 mg Oral Q6H PRN Viviann Spare, NP      . thiamine (VITAMIN B-1) tablet 100 mg  100 mg Oral Daily Curlene Labrum Readling, MD   100 mg at 10/14/11 0800    Lab  Results: No results found for this or any previous visit (from the past 48 hour(s)).  Physical Findings: AIMS:  , ,  ,  ,    CIWA:  CIWA-Ar Total: 1  COWS:     Treatment Plan Summary: Daily contact with patient to assess and evaluate symptoms and progress in treatment Medication management  Plan: We'll continue his current plan of care, and possibly discharge tomorrow.  Derinda Bartus 10/14/2011, 1:18 PM

## 2011-10-14 NOTE — Progress Notes (Signed)
Patient ID: Jesse Stafford, male   DOB: June 03, 1956, 56 y.o.   MRN: [id number] Pt. attended and participated in aftercare planning group. Pt. accepted information on suicide prevention, warning signs to look for with suicide and crisis line numbers to use. The pt. agreed to call crisis line numbers if having warning signs or having thoughts of suicide. Pt. listed their current anxiety level as a 1and depression as a 2 on a scale of  to 10 with being the high.

## 2011-10-14 NOTE — Progress Notes (Signed)
Patient ID: Jesse Stafford, male   DOB: 15-Aug-1955, 56 y.o.   MRN: [id number] 05-12- 13 nursing shift note; pt has voiced no complaints and shown no s/s of distress. He did state he is ready for discharge. On his inventory sheet he wrote slept well, appetite good, energy normal, attention good with depression and hopelessness both at 1. W/d symptoms have been cravings. Denied any suicidal or self harm thoughts. No physical problems. When he gets home he want to " try to make things better". He doesn't see any problem staying on his medications after discharge. rn will monitor and q 15 min checks continue.

## 2011-10-14 NOTE — Progress Notes (Signed)
Patient ID: Jesse Stafford, male   DOB: 1955/06/20, 56 y.o.   MRN: [id number]  [facility] Group Notes:  (Counselor/Nursing/MHT/Case Management/Adjunct)  10/14/2011 1:15 PM  Type of Therapy:  Group Therapy, Dance/Movement Therapy   Participation Level:  Active  Participation Quality:  Appropriate  Affect:  Appropriate  Cognitive:  Appropriate  Insight:  Limited  Engagement in Group:  Good  Engagement in Therapy:  Good  Modes of Intervention:  Clarification, Problem-solving, Role-play, Socialization and Support  Summary of Progress/Problems:  Therapist discussed the meaning of supports and asked group what does support mean to them.  Group discussed the difference between positive and negative support. Pt. give an example of his parents road to sobriety.  Pt. stated that  " both of my parents were alcoholics and when their health started to decline they both quit cold [place]". Pt. is aware that soberity is achievable and that each individual level of support may vary.  Rhunette Croft

## 2011-10-15 MED ORDER — CITALOPRAM HYDROBROMIDE 20 MG PO TABS: 20.0000 mg | ORAL_TABLET | Freq: Every day | ORAL | Status: DC

## 2011-10-15 MED ORDER — HYDROCORTISONE 2.5 % RE CREA: TOPICAL_CREAM | Freq: Two times a day (BID) | RECTAL | Status: AC

## 2011-10-15 MED ORDER — AMLODIPINE BESYLATE 10 MG PO TABS: 10.0000 mg | ORAL_TABLET | Freq: Every day | ORAL | Status: DC

## 2011-10-15 MED ORDER — ATENOLOL 25 MG PO TABS: 25.0000 mg | ORAL_TABLET | ORAL | Status: DC

## 2011-10-15 NOTE — Treatment Plan (Signed)
Interdisciplinary Treatment Plan Update (Adult)  Date: 10/15/2011  Time Reviewed: 10:36 AM   Progress in Treatment: Attending groups: Yes Participating in groups: Yes Taking medication as prescribed: Yes Tolerating medication: Yes   Family/Significant othe contact made:   Patient understands diagnosis:  Yes Discussing patient identified problems/goals with staff:  Yes Medical problems stabilized or resolved:  Yes Denies suicidal/homicidal ideation: Yes  In tx team Issues/concerns per patient self-inventory:  Yes Other:  New problem(s) identified: N/A  Reason for Continuation of Hospitalization: Other; describe .D/C today  Interventions implemented related to continuation of hospitalization:   Additional comments:  Estimated length of stay:   Discharge Plan: D/C today  Return home, follow up at [facility]): N/A  Review of initial/current patient goals per problem list:   1.  Goal(s):Address substance abuse  Met:  Yes  Target date:5/13  As evidenced [id number] denies he has a substance abuse issue  2.  Goal (s): Decrease depression  Met:  Yes  Target date:5/13  As evidenced [id number] rates his depression as a 2 on his self inventory  3.  Goal(s):  Met:  Yes  Target date:  As evidenced by:  4.  Goal(s):  Met:  Yes  Target date:  As evidenced by:  Attendees: Patient:  Jesse Stafford 10/15/2011 10:36 AM  Family:     Physician:  Lupe Carney 10/15/2011 10:36 AM   Nursing: Robbie Louis   10/15/2011 10:36 AM   Case Manager:  Richelle Ito, LCSW 10/15/2011 10:36 AM   Counselor:  Ronda Fairly, LCSWA 10/15/2011 10:36 AM   Other:     Other:     Other:     Other:      Scribe for Treatment Team:   Ida Rogue, 10/15/2011 10:36 AM

## 2011-10-15 NOTE — Progress Notes (Signed)
Writer spoke with patient's nephew, Johnedward Brodrick, at [phone] who was pleased to know of patient's discharge.  Conversation included discussion about Jesse Stafford's desire to return to [place] and return to work at least part time. Nephew states that on occassions pt has gone to visit in [place] he has been jailed due to intoxication and family has withheld funding as they do not wish to see him drinking. Nephew states he would be supportive of assisting patient to acquire work in [place] once physician states he is cleared. Conversation also included patient's risk factors for suicidal ideation, warning signs and resources to contact for help.  Mobile crisis services explained and contact number given to nephew.   Clide Dales 10/15/2011 4:54 PM

## 2011-10-15 NOTE — Progress Notes (Signed)
[facility] Group Notes:  (Counselor/Nursing/MHT/Case Management/Adjunct)  10/15/2011 2:19 PM  Type of Therapy:  Group Therapy at 11:00 AM  Participation Level:  Active  Participation Quality:  Appropriate, Attentive and Sharing  Affect:  Appropriate  Cognitive:  Appropriate  Insight:  Good  Engagement in Group:  Good  Engagement in Therapy:  Good  Modes of Intervention:  Clarification, Socialization and Support  Summary of Progress/Problems: Group discussion focused on what patient's see as their own obstacles to recovery.  Patient shares belief that [place] location, lack of transportation and boredom  will be difficult to deal with.  Jesse Stafford also shared that he will keep others in his mind in effort to help them as much as people here have helped him  Clide Dales 10/15/2011, 2:23 PM  [facility] Group Notes:  (Counselor/Nursing/MHT/Case Management/Adjunct)  10/15/2011 2:23 PM  Type of Therapy:  Group Therapy  Participation Level:  Active  Participation Quality:  Attentive  Affect:  Appropriate  Cognitive:  Appropriate  Engagement in Group:  Good  Modes of Intervention:  Education and Support  Summary of Progress/Problems: Patient attended group presentation by Interior and spatial designer of  [employer] ([employer]). Jesse Stafford was attentive and appropriate during session.     Clide Dales 10/15/2011, 2:25 PM

## 2011-10-15 NOTE — Progress Notes (Signed)
Pt was discharged home today.  He denied any S/I H/I or A/V hallucinations.    He was given f/u appointment, rx, and hotline info booklet.  He voiced understanding to all instructions provided.  He declined the need for smoking cessation materials.  He removed his nicotine patch before he left.
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
## 2011-10-15 NOTE — Progress Notes (Signed)
[facility] Case Management Discharge Plan:  Will you be returning to the same living situation after discharge: Yes,  home At discharge, do you have transportation home?:Yes,  sheriff Do you have the ability to pay for your medications:Yes,  mental health  Interagency Information:     Release of information consent forms completed and in the chart;  Patient's signature needed at discharge.  Patient to Follow up at:  Follow-up Information    Follow up with [facility]. (Go to your hospital follow up appointment 2 days after d/c at 8:00 AM)    Contact information:   [address]  [place]  [phone]         Patient denies SI/HI:   Yes,  yes    Safety Planning and Suicide Prevention discussed:  Yes,  yes  Barrier to discharge identified:No.  Summary and Recommendations:   Jesse Stafford 10/15/2011, 2:02 PM

## 2011-10-17 NOTE — Progress Notes (Signed)
Patient Discharge Instructions:  After Visit Summary (AVS):   Faxed to:  10/17/2011 Psychiatric Admission Assessment Note:   Faxed to:  10/17/2011 Suicide Risk Assessment - Discharge Assessment:   Faxed to:  10/17/2011 Faxed/Sent to the Next Level Care provider:  10/17/2011  Faxed to [facility] @ [phone]  Yossef Gilkison, Eduard Clos, 10/17/2011, 5:22 PM

## 2011-10-18 MED ORDER — ASPIRIN 325 MG PO TABS: ORAL_TABLET | ORAL | Status: DC

## 2011-10-18 MED ORDER — AMLODIPINE BESYLATE-VALSARTAN 10-320 MG PO TABS: 1.0000 | ORAL_TABLET | Freq: Every evening | ORAL | Status: DC

## 2011-10-18 MED ORDER — AMLODIPINE BESYLATE 10 MG PO TABS: 10.0000 mg | ORAL_TABLET | Freq: Every day | ORAL | Status: DC
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
## 2012-01-14 ENCOUNTER — Emergency Department (HOSPITAL_COMMUNITY): Payer: MEDICAID

## 2012-01-14 ENCOUNTER — Other Ambulatory Visit: Payer: Self-pay

## 2012-01-14 ENCOUNTER — Emergency Department (HOSPITAL_COMMUNITY)
Admission: EM | Admit: 2012-01-14 | Discharge: 2012-01-15 | Disposition: A | Discharge status: Other Acute Inpatient Hospital | Payer: MEDICAID | Attending: Emergency Medicine | Admitting: Emergency Medicine

## 2012-01-14 ENCOUNTER — Encounter (HOSPITAL_COMMUNITY): Payer: Self-pay

## 2012-01-14 DIAGNOSIS — R51 Headache: Secondary | ICD-10-CM | POA: Insufficient documentation

## 2012-01-14 DIAGNOSIS — M542 Cervicalgia: Secondary | ICD-10-CM | POA: Insufficient documentation

## 2012-01-14 DIAGNOSIS — M549 Dorsalgia, unspecified: Secondary | ICD-10-CM | POA: Insufficient documentation

## 2012-01-14 DIAGNOSIS — F17200 Nicotine dependence, unspecified, uncomplicated: Secondary | ICD-10-CM | POA: Insufficient documentation

## 2012-01-14 DIAGNOSIS — R45851 Suicidal ideations: Secondary | ICD-10-CM | POA: Insufficient documentation

## 2012-01-14 DIAGNOSIS — R197 Diarrhea, unspecified: Secondary | ICD-10-CM | POA: Insufficient documentation

## 2012-01-14 DIAGNOSIS — R079 Chest pain, unspecified: Secondary | ICD-10-CM | POA: Insufficient documentation

## 2012-01-14 DIAGNOSIS — F10929 Alcohol use, unspecified with intoxication, unspecified: Secondary | ICD-10-CM

## 2012-01-14 DIAGNOSIS — R209 Unspecified disturbances of skin sensation: Secondary | ICD-10-CM | POA: Insufficient documentation

## 2012-01-14 DIAGNOSIS — F1010 Alcohol abuse, uncomplicated: Secondary | ICD-10-CM | POA: Insufficient documentation

## 2012-01-14 DIAGNOSIS — R0602 Shortness of breath: Secondary | ICD-10-CM | POA: Insufficient documentation

## 2012-01-14 LAB — CBC WITH DIFFERENTIAL/PLATELET
Basophils Absolute: 0.1 10*3/uL (ref 0.0–0.1)
Basophils Relative: 1 % (ref 0–1)
Eosinophils Relative: 2 % (ref 0–5)
HCT: 41.8 % (ref 39.0–52.0)
Hemoglobin: 14.5 g/dL (ref 13.0–17.0)
MCH: 31 pg (ref 26.0–34.0)
MCHC: 34.7 g/dL (ref 30.0–36.0)
MCV: 89.5 fL (ref 78.0–100.0)
Monocytes Absolute: 0.4 10*3/uL (ref 0.1–1.0)
Monocytes Relative: 6 % (ref 3–12)
Neutro Abs: 3.3 10*3/uL (ref 1.7–7.7)
RDW: 13 % (ref 11.5–15.5)

## 2012-01-14 LAB — COMPREHENSIVE METABOLIC PANEL
AST: 68 U/L — ABNORMAL HIGH (ref 0–37)
Albumin: 3.8 g/dL (ref 3.5–5.2)
BUN: 10 mg/dL (ref 6–23)
Calcium: 8.9 mg/dL (ref 8.4–10.5)
Chloride: 109 mEq/L (ref 96–112)
Creatinine, Ser: 1.08 mg/dL (ref 0.50–1.35)
GFR calc non Af Amer: 76 mL/min — ABNORMAL LOW (ref >90)
Total Bilirubin: 0.3 mg/dL (ref 0.3–1.2)

## 2012-01-14 LAB — LIPASE, BLOOD: Lipase: 75 U/L — ABNORMAL HIGH (ref 11–59)

## 2012-01-14 LAB — ETHANOL: Alcohol, Ethyl (B): 237 mg/dL — ABNORMAL HIGH (ref 0–11)

## 2012-01-14 LAB — URINALYSIS, ROUTINE W REFLEX MICROSCOPIC
Glucose, UA: NEGATIVE mg/dL
Leukocytes, UA: NEGATIVE
Protein, ur: NEGATIVE mg/dL
Specific Gravity, Urine: <1.005 — ABNORMAL LOW (ref 1.005–1.030)
Urobilinogen, UA: 0.2 mg/dL (ref 0.0–1.0)

## 2012-01-14 LAB — ACETAMINOPHEN LEVEL: Acetaminophen (Tylenol), Serum: <15 ug/mL (ref 10–30)

## 2012-01-14 LAB — URINE MICROSCOPIC-ADD ON

## 2012-01-14 LAB — RAPID URINE DRUG SCREEN, HOSP PERFORMED: Opiates: NOT DETECTED

## 2012-01-14 MED ORDER — NICOTINE 21 MG/24HR TD PT24
21.0000 mg | MEDICATED_PATCH | Freq: Once | TRANSDERMAL | Status: AC
  Administered 2012-01-14: 21 mg via TRANSDERMAL
  Filled 2012-01-14 (×2): qty 1

## 2012-01-14 MED ORDER — SODIUM CHLORIDE 0.9 % IV SOLN
INTRAVENOUS | Status: DC
  Administered 2012-01-14 – 2012-01-15 (×2): via INTRAVENOUS

## 2012-01-14 MED ORDER — SODIUM CHLORIDE 0.9 % IV BOLUS (SEPSIS)
250.0000 mL | Freq: Once | INTRAVENOUS | Status: AC
  Administered 2012-01-14: 250 mL via INTRAVENOUS

## 2012-01-14 MED ORDER — ONDANSETRON HCL 4 MG/2ML IJ SOLN
4.0000 mg | Freq: Once | INTRAMUSCULAR | Status: AC
  Administered 2012-01-14: 4 mg via INTRAVENOUS
  Filled 2012-01-14: qty 2

## 2012-01-14 NOTE — ED Provider Notes (Addendum)
History  This chart was scribed for [name], MD by Erskine Emery. This patient was seen in room APA02/APA02 and the patient's care was started at 16:21.   CSN: [id number]  Arrival date & time 01/14/12  1619   First MD Initiated Contact with Patient 01/14/12 1621      Chief Complaint  Patient presents with  . Drug Overdose    (Consider location/radiation/quality/duration/timing/severity/associated sxs/prior treatment) HPI Jesse Stafford is a 56 y.o. male brought in by ambulance, who presents to the Emergency Department complaining of a drug overdose this morning, with associated SOB and mild headache. Pt reports he took approximately 60 xanax and drank about 3 beers from 10am-11am this morning. The pt's neighbors called EMS after they saw him "pass out" while walking in his yard. Pt reports he took the pills because he was angry and doesn't know if he was suicidal or not. Pt denies any associated emesis, chest pain, abdominal pain, nausea, dysuria, or difficulty urinating. Pt does report some diarrhea earlier today, but none yesterday and also is experiencing some moderate neck and back pain. Pt reports he is prescribed the xanax for his anxiety.   Dr. Margo Aye is the pt's PCP.    Past Medical History  Diagnosis Date  . Chest pain 2007    Associated with syncope in 2011  . Hypertension     Echo in 2011-mild LVH, hyperdynamic LV function, mild to moderate AI  . Alcohol abuse   . Tobacco abuse     20 pack years; 1/2 pack per day  . Thrombocytopenia     possible alcohol toxicity; 122,000  . Chronic sinusitis     By CT  . Chest pain   . Stroke   . Depression     History reviewed. No pertinent past surgical history.  Family History  Problem Relation Age of Onset  . Hypertension    . Alcohol abuse Neg Hx   . Coronary artery disease Mother 38    CABG  . Coronary artery disease Father 66    CABG    History  Substance Use Topics  . Smoking status: Current  Everyday Smoker -- 1.0 packs/day for 40 years    Types: Cigarettes  . Smokeless tobacco: Never Used  . Alcohol Use: 15.0 oz/week    30 drink(s) per week     Multiple hospital admissions with chest pain and inebriation // states drinks 12 pck weekly      Review of Systems  Constitutional: Negative for fever and chills.  HENT: Positive for neck pain.   Eyes: Negative for visual disturbance.  Respiratory: Positive for shortness of breath.   Cardiovascular: Negative for chest pain.  Gastrointestinal: Positive for diarrhea. Negative for nausea, vomiting and abdominal pain.  Genitourinary: Negative for dysuria and difficulty urinating.  Musculoskeletal: Positive for back pain.  Skin: Negative for wound.  Neurological: Positive for headaches. Negative for weakness.  Psychiatric/Behavioral: Negative for hallucinations.    Allergies  Review of patient's allergies indicates no known allergies.  Home Medications   Current Outpatient Rx  Name Route Sig Dispense Refill  . AMLODIPINE BESYLATE 10 MG PO TABS Oral Take 1 tablet (10 mg total) by mouth daily. For hypertension. 30 tablet 0  . AMLODIPINE BESYLATE-VALSARTAN 10-320 MG PO TABS Oral Take 1 tablet by mouth every evening. For hypertension    . ASPIRIN 325 MG PO TABS  Take one tablet each day to prevent heart attacks.    . ATENOLOL 25 MG PO  TABS Oral Take 1 tablet (25 mg total) by mouth every morning. For hypertension. 30 tablet 0  . CITALOPRAM HYDROBROMIDE 20 MG PO TABS Oral Take 1 tablet (20 mg total) by mouth daily. For depression and anxiety. 30 tablet 0    Triage Vitals: BP 113/72  Pulse 107  Temp 97.7 F (36.5 C) (Oral)  Resp 20  Ht 5\' 11"  (1.803 m)  Wt 160 lb (72.576 kg)  BMI 22.32 kg/m2  SpO2 90%  Physical Exam  Nursing note and vitals reviewed. Constitutional: He is oriented to person, place, and time. He appears well-developed and well-nourished. No distress.  HENT:  Head: Normocephalic and atraumatic.       Mucus  membranes are a little dry.  Eyes: EOM are normal. Pupils are equal, round, and reactive to light.  Neck: Neck supple. No tracheal deviation present.  Cardiovascular: Normal rate, regular rhythm and normal heart sounds.   No murmur heard. Pulmonary/Chest: Effort normal and breath sounds normal. No respiratory distress.  Abdominal: Soft. Bowel sounds are normal. He exhibits no distension. There is no tenderness.  Musculoskeletal: Normal range of motion. He exhibits no edema.       Capillary refill in toes is 1 second. No flexion in the left 4th and 5th fingers, stiff to passive flexion.   Lymphadenopathy:    He has no cervical adenopathy.  Neurological: He is oriented to person, place, and time. No cranial nerve deficit. Coordination normal.  Skin: Skin is warm and dry.    ED Course  Procedures (including critical care time) DIAGNOSTIC STUDIES: Oxygen Saturation is 90% on room air, adequate by my interpretation.    COORDINATION OF CARE: 16:44-I evaluated the patient and we discussed a treatment plan including admission to the hospital to which the pt agreed.    Labs Reviewed - No data to display No results found.  Date: 01/14/2012  Rate: 87  Rhythm: normal sinus rhythm  QRS Axis: normal  Intervals: normal  ST/T Wave abnormalities: normal  Conduction Disutrbances:none  Narrative Interpretation:   Old EKG Reviewed: none available   Date: 01/14/2012  Rate: 71  Rhythm: normal sinus rhythm  QRS Axis: normal  Intervals: normal  ST/T Wave abnormalities: normal  Conduction Disutrbances:none  Narrative Interpretation:   Old EKG Reviewed: unchanged EKG is unchanged in EKG done at 4 PM this afternoon when the patient arrived. Second EKG was done for a complaint of intermittent chest pain.  No diagnosis found.    MDM  Patient initially stated that he had taken 60 tablets of Xanax between 10 and 11 AM this morning however the urine drug screen is negative for benzos he did in  mid to drinking 3 beers but his alcohol level was elevated in the 200 range. Patient is now very alert and has been cleared medically we'll not require a medical admission which we originally thought due to the extensive benzo diazepam overdose which did not occur. Patient will be evaluated by behavioral health team. Patient was placed on an involuntary commitment when he first got here because he was trying to leave.     I personally performed the services described in this documentation, which was scribed in my presence. The recorded information has been reviewed and considered.     [name], MD 01/14/12 2149  Addendum:  Patient awaiting behavioral health evaluation has been medically cleared but he recently started complaining of intermittent chest pain and numbness in all 4 ext. Repeat EKG shows no change in  EKG done at 4:00 serotonin cardiac troponin marker will be ordered.  [name], MD 01/14/12 2151  Troponin during the time of the chest pain was negative second troponin has been ordered and is pending. Other than the concern for the chest pain the patient has been medically cleared and has been seen by behavioral health team for placement. We will reevaluate in the morning if the chest pain remains a non-diagnostic entity patient will be cleared completely to go to behavioral health.    [name], MD 01/15/12 [phone]

## 2012-01-14 NOTE — ED Notes (Signed)
Patient became agitated prior and took all leads off from heart monitor and his pulse ox plus b/p cuff.  Since nurse is aware and patient is resting we did not put equipment back on patient.

## 2012-01-14 NOTE — ED Notes (Signed)
Patient complaining of chest pain. States "It comes and goes sometimes." MD aware.

## 2012-01-14 NOTE — ED Notes (Signed)
Patient complaining of sudden onset of numbness in hands and feet. Advised MD. Verbal order for EKG and CT Head.

## 2012-01-14 NOTE — ED Notes (Signed)
Patient sitting up in bed eating meal tray at this time. No obvious distress noted. Gave patient diet coke as requested. Patient alert and oriented at this time.

## 2012-01-14 NOTE — BH Assessment (Addendum)
Assessment Note   Jesse Stafford is an 56 y.o. male. PT PRESENTED TO THE ER BY EMS AFTER FALLING OUT IN HIS YARD TODAY.  PT REPORTS HE TOOK 60 XANAX PILLS AND DRANK 3-4 40OZ BEERS IN A SUICIDE ATTEMPT. HE REPORTS BEING DEPRESSED ANS SUICIDAL EVERYDAY. HIS DEPRESSION  RESULTED FROM BEING DENIED DISABILITY, NOT HAVING ANY FUNDS AND GIVING UP ON LIFE IN GENERAL. HE REPORTS NOT CARING WHAT HAPPENS TO HIM. PT'S UDS WAS NEGATIVE YET PT CONTINUES TO REPORT HE TOOK THE XANAX.PT DENIES H/I AND IS NOT PSYCHOTIC.      Axis I: Major Depression, Recurrent severe WITHOUT PSYCHOTIC FEATURES Axis II: Deferred Axis III:  Past Medical History  Diagnosis Date  . Chest pain 2007    Associated with syncope in 2011  . Hypertension     Echo in 2011-mild LVH, hyperdynamic LV function, mild to moderate AI  . Alcohol abuse   . Tobacco abuse     20 pack years; 1/2 pack per day  . Thrombocytopenia     possible alcohol toxicity; 122,000  . Chronic sinusitis     By CT  . Chest pain   . Stroke   . Depression    Axis IV: economic problems and other psychosocial or environmental problems Axis V: 11-20 some danger of hurting self or others possible OR occasionally fails to maintain minimal personal hygiene OR gross impairment in communication       Past Medical History:  Past Medical History  Diagnosis Date  . Chest pain 2007    Associated with syncope in 2011  . Hypertension     Echo in 2011-mild LVH, hyperdynamic LV function, mild to moderate AI  . Alcohol abuse   . Tobacco abuse     20 pack years; 1/2 pack per day  . Thrombocytopenia     possible alcohol toxicity; 122,000  . Chronic sinusitis     By CT  . Chest pain   . Stroke   . Depression     History reviewed. No pertinent past surgical history.  Family History:  Family History  Problem Relation Age of Onset  . Hypertension    . Alcohol abuse Neg Hx   . Coronary artery disease Mother 66    CABG  . Coronary artery disease  Father 24    CABG    Social History:  reports that he has been smoking Cigarettes.  He has a 40 pack-year smoking history. He has never used smokeless tobacco. He reports that he drinks about 15 ounces of alcohol per week. He reports that he does not use illicit drugs.  Additional Social History:     CIWA: CIWA-Ar BP: 130/80 mmHg Pulse Rate: 79  COWS:    Allergies: No Known Allergies  Home Medications:  (Not in a hospital admission)  OB/GYN Status:  No LMP for male patient.  General Assessment Data Location of Assessment: [facility] Assessment: Yes Living Arrangements: Alone (Brother is visiting him for a short period currently) Can pt return to current living arrangement?: Yes Admission Status: Involuntary Is patient capable of signing voluntary admission?: No Transfer from: Acute Hospital Referral Source: MD (DR Vanetta Mulders)  Education Status Contact person: GLENDA TAYLOR-[phone]  Risk to self Suicidal Ideation: Yes-Currently Present Suicidal Intent: Yes-Currently Present Is patient at risk for suicide?: Yes Suicidal Plan?: Yes-Currently Present Specify Current Suicidal Plan: OVERDOSE ON XANAX AND BEER Access to Means: Yes Specify Access to Suicidal Means: HAS XANAX PILLS AND BEER What has  been your use of drugs/alcohol within the last 12 months?: BEER Previous Attempts/Gestures: Yes How many times?: 1  Other Self Harm Risks: YES Triggers for Past Attempts: Other personal contacts (FINANCIAL, NOT APPROVED FOR DISABILITY) Intentional Self Injurious Behavior: None Family Suicide History: No (MOTHER ATTEMPTED) Recent stressful life event(s): Financial Problems;Other (Comment) (NOT BEING APPROVED FOR DISABILITY) Persecutory voices/beliefs?: No Depression: Yes Depression Symptoms: Despondent;Insomnia;Tearfulness;Isolating;Fatigue;Loss of interest in usual pleasures;Feeling worthless/self pity;Feeling angry/irritable Substance abuse history and/or treatment for  substance abuse?: No Suicide prevention information given to non-admitted patients: Not applicable  Risk to Others Homicidal Ideation: No Thoughts of Harm to Others: No Current Homicidal Intent: No Current Homicidal Plan: No Access to Homicidal Means: No History of harm to others?: No Assessment of Violence: None Noted Violent Behavior Description: NA Does patient have access to weapons?: No Criminal Charges Pending?: No Does patient have a court date: No  Psychosis Hallucinations: None noted Delusions: None noted  Mental Status Report Appear/Hygiene: Improved Eye Contact: Good Motor Activity: Freedom of movement Speech: Logical/coherent Level of Consciousness: Alert Mood: Depressed;Despair;Helpless;Sad;Worthless, low self-esteem Affect: Appropriate to circumstance;Blunted;Depressed;Sad Anxiety Level: Minimal Thought Processes: Coherent;Relevant Judgement: Impaired Orientation: Person;Place;Time;Situation Obsessive Compulsive Thoughts/Behaviors: None  Cognitive Functioning Concentration: Normal Memory: Recent Intact;Remote Intact IQ: Average Insight: Poor Impulse Control: Poor Appetite: Fair Sleep: Decreased Total Hours of Sleep: 4  Vegetative Symptoms: None  ADLScreening [facility] Services) Patient's cognitive ability adequate to safely complete daily activities?: Yes Patient able to express need for assistance with ADLs?: Yes Independently performs ADLs?: Yes  Abuse/Neglect [facility]) Physical Abuse: Denies Verbal Abuse: Denies Sexual Abuse: Denies  Prior Inpatient Therapy Prior Inpatient Therapy: Yes Prior Therapy Dates: 2012 Prior Therapy Facilty/Provider(s): [facility] Reason for Treatment: DEPRESSED, S/I  Prior Outpatient Therapy Prior Outpatient Therapy: No  ADL Screening (condition at time of admission) Patient's cognitive ability adequate to safely complete daily activities?: Yes Patient able to express need for assistance with ADLs?:  Yes Independently performs ADLs?: Yes Weakness of Legs: None Weakness of Arms/Hands: None  Home Assistive Devices/Equipment Home Assistive Devices/Equipment: None  Therapy Consults (therapy consults require a physician order) PT Evaluation Needed: No OT Evalulation Needed: No SLP Evaluation Needed: No Abuse/Neglect Assessment (Assessment to be complete while patient is alone) Physical Abuse: Denies Verbal Abuse: Denies Sexual Abuse: Denies Exploitation of patient/patient's resources: Denies Values / Beliefs Cultural Requests During Hospitalization: None Spiritual Requests During Hospitalization: None Consults Spiritual Care Consult Needed: No Social Work Consult Needed: No Merchant navy officer (For Healthcare) Advance Directive: Patient does not have advance directive;Patient would not like information Pre-existing out of facility DNR order (yellow form or pink MOST form): No    Additional Information 1:1 In Past 12 Months?: No CIRT Risk: No Elopement Risk: No Does patient have medical clearance?: Yes     Disposition: REFERRED TO [facility]. No beds available at [facility], patient referred to [facility], as there are available beds. Patient accepted to [facility] by Dr Wendall Stade as an involuntary admission. Transportation will be by [facility]. Dr Estell Harpin is in agreement with the disposition. Disposition Disposition of Patient: Inpatient treatment program Type of inpatient treatment program: Adult  On Site Evaluation by:   Reviewed with Physician:  DR Carlyle Dolly Winford 01/14/2012 9:41 PM

## 2012-01-14 NOTE — ED Notes (Signed)
Pt also admits to taking 6 bp pills today also.

## 2012-01-14 NOTE — ED Notes (Signed)
Pt was walking in yard and seen by neighbors "passed out" , pt reports that he took ?60 xanax, several beers today.

## 2012-01-14 NOTE — ED Notes (Signed)
Pt became agitated, removing monitor leads stating he wants to go home and he does not need to be here.  Sitter at bedside and calmed pt.  edp notified and IVC papers completed.

## 2012-01-14 NOTE — ED Notes (Signed)
Pt requesting nicotine patch.  Orders received.

## 2012-01-15 MED ORDER — ATENOLOL 25 MG PO TABS
25.0000 mg | ORAL_TABLET | Freq: Every day | ORAL | Status: DC
  Administered 2012-01-15: 25 mg via ORAL
  Filled 2012-01-15: qty 1

## 2012-01-15 MED ORDER — ALBUTEROL SULFATE HFA 108 (90 BASE) MCG/ACT IN AERS: 2.0000 | INHALATION_SPRAY | Freq: Four times a day (QID) | RESPIRATORY_TRACT | Status: DC | PRN

## 2012-01-15 MED ORDER — ACETAMINOPHEN 325 MG PO TABS: 650.0000 mg | ORAL_TABLET | ORAL | Status: DC | PRN

## 2012-01-15 MED ORDER — ONDANSETRON HCL 4 MG PO TABS: 4.0000 mg | ORAL_TABLET | Freq: Three times a day (TID) | ORAL | Status: DC | PRN

## 2012-01-15 NOTE — ED Notes (Signed)
Await OK from [facility] to call report on pt

## 2012-01-15 NOTE — ED Provider Notes (Signed)
0122 Patient has been evaluated by [facility], Hattie Perch. No bed available. She has submitted the paperwork for consideration. Psychiatrist would like the patient observed overnight and document chest pain evaluation.  Nicoletta Dress. Colon Branch, MD 01/15/12 [id number]

## 2012-01-15 NOTE — ED Notes (Signed)
Gave patient ice water as requested.  

## 2012-01-15 NOTE — ED Notes (Signed)
Patient denies chest pain or any pain at this time. Also states his numbness in his hands and feet has gone. States "about every 2 weeks, I will have chest pain that might be sharp or aching. And sometimes I have a numbness in my hands or feet and it makes me think I am having another stroke." No obvious distress noted. Breathing regular and unlabored at this time. Sitter remains at bedside.

## 2012-01-15 NOTE — BHH Counselor (Signed)
Patient referred to [facility]. Patient accepted by Dr. Wendall Stade.  [facility] authorization obtained. Called [facility] to remove patient from wait list. Support paperwork and IVC paperwork copied and distributed.

## 2012-05-21 ENCOUNTER — Emergency Department (HOSPITAL_COMMUNITY): Payer: Medicaid Other

## 2012-05-21 ENCOUNTER — Emergency Department (HOSPITAL_COMMUNITY)
Admission: EM | Admit: 2012-05-21 | Discharge: 2012-05-21 | Disposition: A | Discharge status: Home / Self Care | Payer: Medicaid Other | Attending: Emergency Medicine | Admitting: Emergency Medicine

## 2012-05-21 ENCOUNTER — Encounter (HOSPITAL_COMMUNITY): Payer: Self-pay | Admitting: Emergency Medicine

## 2012-05-21 DIAGNOSIS — R0602 Shortness of breath: Secondary | ICD-10-CM | POA: Insufficient documentation

## 2012-05-21 DIAGNOSIS — S99929A Unspecified injury of unspecified foot, initial encounter: Secondary | ICD-10-CM | POA: Insufficient documentation

## 2012-05-21 DIAGNOSIS — Z8679 Personal history of other diseases of the circulatory system: Secondary | ICD-10-CM | POA: Insufficient documentation

## 2012-05-21 DIAGNOSIS — S93409A Sprain of unspecified ligament of unspecified ankle, initial encounter: Secondary | ICD-10-CM | POA: Insufficient documentation

## 2012-05-21 DIAGNOSIS — I10 Essential (primary) hypertension: Secondary | ICD-10-CM | POA: Insufficient documentation

## 2012-05-21 DIAGNOSIS — F10929 Alcohol use, unspecified with intoxication, unspecified: Secondary | ICD-10-CM

## 2012-05-21 DIAGNOSIS — Z862 Personal history of diseases of the blood and blood-forming organs and certain disorders involving the immune mechanism: Secondary | ICD-10-CM | POA: Insufficient documentation

## 2012-05-21 DIAGNOSIS — R404 Transient alteration of awareness: Secondary | ICD-10-CM | POA: Insufficient documentation

## 2012-05-21 DIAGNOSIS — Y929 Unspecified place or not applicable: Secondary | ICD-10-CM | POA: Insufficient documentation

## 2012-05-21 DIAGNOSIS — Z8659 Personal history of other mental and behavioral disorders: Secondary | ICD-10-CM | POA: Insufficient documentation

## 2012-05-21 DIAGNOSIS — F17200 Nicotine dependence, unspecified, uncomplicated: Secondary | ICD-10-CM | POA: Insufficient documentation

## 2012-05-21 DIAGNOSIS — R296 Repeated falls: Secondary | ICD-10-CM | POA: Insufficient documentation

## 2012-05-21 DIAGNOSIS — F10229 Alcohol dependence with intoxication, unspecified: Secondary | ICD-10-CM | POA: Insufficient documentation

## 2012-05-21 DIAGNOSIS — S8990XA Unspecified injury of unspecified lower leg, initial encounter: Secondary | ICD-10-CM | POA: Insufficient documentation

## 2012-05-21 DIAGNOSIS — Z8673 Personal history of transient ischemic attack (TIA), and cerebral infarction without residual deficits: Secondary | ICD-10-CM | POA: Insufficient documentation

## 2012-05-21 DIAGNOSIS — S93402A Sprain of unspecified ligament of left ankle, initial encounter: Secondary | ICD-10-CM

## 2012-05-21 DIAGNOSIS — Z79899 Other long term (current) drug therapy: Secondary | ICD-10-CM | POA: Insufficient documentation

## 2012-05-21 DIAGNOSIS — Z8709 Personal history of other diseases of the respiratory system: Secondary | ICD-10-CM | POA: Insufficient documentation

## 2012-05-21 DIAGNOSIS — Y939 Activity, unspecified: Secondary | ICD-10-CM | POA: Insufficient documentation

## 2012-05-21 DIAGNOSIS — S82899A Other fracture of unspecified lower leg, initial encounter for closed fracture: Secondary | ICD-10-CM | POA: Insufficient documentation

## 2012-05-21 LAB — CBC WITH DIFFERENTIAL/PLATELET
Basophils Absolute: 0.1 10*3/uL (ref 0.0–0.1)
Eosinophils Relative: 3 % (ref 0–5)
HCT: 49.3 % (ref 39.0–52.0)
Hemoglobin: 17.5 g/dL — ABNORMAL HIGH (ref 13.0–17.0)
Lymphocytes Relative: 45 % (ref 12–46)
Lymphs Abs: 3.4 10*3/uL (ref 0.7–4.0)
MCV: 89.3 fL (ref 78.0–100.0)
Monocytes Absolute: 0.3 10*3/uL (ref 0.1–1.0)
Monocytes Relative: 4 % (ref 3–12)
RDW: 13.1 % (ref 11.5–15.5)
WBC: 7.5 10*3/uL (ref 4.0–10.5)

## 2012-05-21 LAB — BASIC METABOLIC PANEL
BUN: 4 mg/dL — ABNORMAL LOW (ref 6–23)
CO2: 24 mEq/L (ref 19–32)
Calcium: 8.8 mg/dL (ref 8.4–10.5)
Creatinine, Ser: 0.63 mg/dL (ref 0.50–1.35)
Glucose, Bld: 113 mg/dL — ABNORMAL HIGH (ref 70–99)

## 2012-05-21 MED ORDER — SODIUM CHLORIDE 0.9 % IV BOLUS (SEPSIS)
1000.0000 mL | Freq: Once | INTRAVENOUS | Status: AC
  Administered 2012-05-21: 1000 mL via INTRAVENOUS

## 2012-05-21 MED ORDER — TRAMADOL HCL 50 MG PO TABS: 50.0000 mg | ORAL_TABLET | Freq: Four times a day (QID) | ORAL | Status: DC | PRN

## 2012-05-21 MED ORDER — SODIUM CHLORIDE 0.9 % IV SOLN: INTRAVENOUS | Status: DC

## 2012-05-21 NOTE — ED Notes (Signed)
Pt has stated multiple times "i am going to leave if you don't tell me what's wrong". Pt was told we are waiting on results from x-ray and pt agreed to stay.

## 2012-05-21 NOTE — ED Notes (Signed)
Patient was placed on the cardiac monitor 

## 2012-05-21 NOTE — ED Notes (Signed)
Pt c/o left ankle pain and SOB that began today. Pt states he fell but is unable to recall event. Pt has possible deformity and swelling to left ankle and reports pain in the area. EMS states pt told them "im having a sharp pain in my chest" in route and shortly after "passed out". Pt reports SOB since event. Pt admits to ETOH today. Two 40 oz beers and a "couple glasses" of wine.

## 2012-05-21 NOTE — ED Provider Notes (Signed)
History   This chart was scribed for Jesse Jakes, MD by Jesse Stafford, ED Scribe. This patient was seen in room [facility] and the patient's care was started at 2:29 PM    CSN: [id number]  Arrival date & time 05/21/12  1406   First MD Initiated Contact with Patient 05/21/12 1409     Level 5 Caveat- Intoxication Chief Complaint  Patient presents with  . Fall  . Near Syncope  . Ankle Pain     The history is provided by the patient. No language interpreter was used.   Jesse Stafford is a 56 y.o. male with h/o HTN and CVA brought in by ambulance to the Emergency Department complaining of left ankle pain after a fall with possible LOC due to intoxication.  EMS states pt reported sharp chest pain on scene but pt denies any current chest pain.  EMS states pt admitted to having drank two 40oz beers and a "couple glasses" of wine.  Pt denies any further pain or injuries. Pt is a current everyday smoker and frequent alcohol user. Past Medical History  Diagnosis Date  . Chest pain 2007    Associated with syncope in 2011  . Hypertension     Echo in 2011-mild LVH, hyperdynamic LV function, mild to moderate AI  . Alcohol abuse   . Tobacco abuse     20 pack years; 1/2 pack per day  . Thrombocytopenia     possible alcohol toxicity; 122,000  . Chronic sinusitis     By CT  . Chest pain   . Stroke   . Depression     History reviewed. No pertinent past surgical history.  Family History  Problem Relation Age of Onset  . Hypertension    . Alcohol abuse Neg Hx   . Coronary artery disease Mother 44    CABG  . Coronary artery disease Father 11    CABG    History  Substance Use Topics  . Smoking status: Current Every Day Smoker -- 1.0 packs/day for 40 years    Types: Cigarettes  . Smokeless tobacco: Never Used  . Alcohol Use: 15.0 oz/week    30 drink(s) per week     Comment: Multiple hospital admissions with chest pain and inebriation // states drinks 12 pck weekly       Review of Systems  Unable to perform ROS: Other  Intoxication  Allergies  Review of patient's allergies indicates no known allergies.  Home Medications   Current Outpatient Rx  Name  Route  Sig  Dispense  Refill  . ALPRAZOLAM 0.25 MG PO TABS   Oral   Take 0.25 mg by mouth every 8 (eight) hours as needed.         Marland Kitchen AMLODIPINE BESYLATE 10 MG PO TABS   Oral   Take 10 mg by mouth daily.         . ASPIRIN 325 MG PO TABS   Oral   Take 325 mg by mouth every other day. Take one tablet each day to prevent heart attacks.         . ATENOLOL 25 MG PO TABS   Oral   Take 25 mg by mouth daily. For hypertension.         Marland Kitchen CLONIDINE HCL 0.1 MG PO TABS   Oral   Take 0.1 mg by mouth 2 (two) times daily.         . ALBUTEROL SULFATE (2.5 MG/3ML) 0.083% IN NEBU  Nebulization   Take 2.5 mg by nebulization every 6 (six) hours as needed.         Marland Kitchen TRAMADOL HCL 50 MG PO TABS   Oral   Take 1 tablet (50 mg total) by mouth every 6 (six) hours as needed for pain.   15 tablet   0     BP 157/94  Pulse 77  Resp 18  SpO2 94%  Physical Exam  Nursing note and vitals reviewed. Constitutional: He is oriented to person, place, and time. He appears well-developed and well-nourished.  HENT:  Head: Normocephalic and atraumatic.       Mucous membranes dry.  Eyes: Conjunctivae normal are normal.  Neck: Normal range of motion. No tracheal deviation present.  Cardiovascular: Normal rate, regular rhythm and normal heart sounds.   No murmur heard.      Left DP pulse 2+, good cap refill.  Pulmonary/Chest: Effort normal and breath sounds normal. He has no wheezes.  Abdominal: Soft. Bowel sounds are normal. There is no tenderness.  Musculoskeletal: Normal range of motion. He exhibits no edema.       Swelling to left lateral ankle, no proximal fibula tenderness. Moving all 4 extremities well.  Neurological: He is alert and oriented to person, place, and time.  Skin: Skin is warm  and dry.  Psychiatric: He has a normal mood and affect.    ED Course  Procedures (including critical care time) DIAGNOSTIC STUDIES: Oxygen Saturation is 94% on room air, adequate by my interpretation.    COORDINATION OF CARE: 2:33 PM- Unable to inform pt of clinical course due to intoxication.   Labs Reviewed  CBC WITH DIFFERENTIAL - Abnormal; Notable for the following:    Hemoglobin 17.5 (*)     Basophils Relative 2 (*)     All other components within normal limits  BASIC METABOLIC PANEL - Abnormal; Notable for the following:    Glucose, Bld 113 (*)     BUN 4 (*)     All other components within normal limits  ETHANOL - Abnormal; Notable for the following:    Alcohol, Ethyl (B) 310 (*)     All other components within normal limits  TROPONIN I   Dg Chest 1 View  05/21/2012  *RADIOLOGY REPORT*  Clinical Data: Near syncope with a fall.  Smoker.  Current history of hypertension.  CHEST - 1 VIEW  Comparison: Portable chest x-ray 01/14/2012.  Two-view chest x-ray 10/09/2011, 08/20/2011.  CTA chest 12/15/2009.  Findings: AP erect image was performed.  Cardiac silhouette upper normal in size but stable, allowing for differences in technique. Suboptimal inspiration accounts for crowded bronchovascular markings, especially in the lung bases, and accentuates the cardiac silhouette.  Taking this into account, lungs clear.  Apparent widening of the superior mediastinum relative to the prior examinations is felt to be related to technique.  IMPRESSION: Suboptimal inspiration.  No acute cardiopulmonary disease.   Original Report Authenticated By: Hulan Saas, M.D.    Dg Ankle Complete Left  05/21/2012  *RADIOLOGY REPORT*  Clinical Data: Larey Seat and injured left ankle.  Lateral pain and swelling.  LEFT ANKLE COMPLETE - 3+ VIEW  Comparison: None.  Findings: Avulsion fracture arising from the tip the lateral malleolus.  No other fractures.  Ankle mortise intact with well- preserved joint space.  Bone  mineral density well preserved.  Small ankle joint effusion/hemarthrosis.  Small enthesopathic spur at the insertion of the Achilles tendon on the posterior calcaneus.  IMPRESSION: Avulsion fracture arising from  the tip of the lateral malleolus. No other fractures.   Original Report Authenticated By: Hulan Saas, M.D.    Results for orders placed during the hospital encounter of 05/21/12  CBC WITH DIFFERENTIAL      Component Value Range   WBC 7.5  4.0 - 10.5 K/uL   RBC 5.52  4.22 - 5.81 MIL/uL   Hemoglobin 17.5 (*) 13.0 - 17.0 g/dL   HCT 29.5  28.4 - 13.2 %   MCV 89.3  78.0 - 100.0 fL   MCH 31.7  26.0 - 34.0 pg   MCHC 35.5  30.0 - 36.0 g/dL   RDW 44.0  10.2 - 72.5 %   Platelets 249  150 - 400 K/uL   Neutrophils Relative 47  43 - 77 %   Neutro Abs 3.6  1.7 - 7.7 K/uL   Lymphocytes Relative 45  12 - 46 %   Lymphs Abs 3.4  0.7 - 4.0 K/uL   Monocytes Relative 4  3 - 12 %   Monocytes Absolute 0.3  0.1 - 1.0 K/uL   Eosinophils Relative 3  0 - 5 %   Eosinophils Absolute 0.2  0.0 - 0.7 K/uL   Basophils Relative 2 (*) 0 - 1 %   Basophils Absolute 0.1  0.0 - 0.1 K/uL  BASIC METABOLIC PANEL      Component Value Range   Sodium 144  135 - 145 mEq/L   Potassium 4.0  3.5 - 5.1 mEq/L   Chloride 108  96 - 112 mEq/L   CO2 24  19 - 32 mEq/L   Glucose, Bld 113 (*) 70 - 99 mg/dL   BUN 4 (*) 6 - 23 mg/dL   Creatinine, Ser 3.66  0.50 - 1.35 mg/dL   Calcium 8.8  8.4 - 44.0 mg/dL   GFR calc non Af Amer >90  >90 mL/min   GFR calc Af Amer >90  >90 mL/min  TROPONIN I      Component Value Range   Troponin I <0.30  <0.30 ng/mL  ETHANOL      Component Value Range   Alcohol, Ethyl (B) 310 (*) 0 - 11 mg/dL    Date: 34/74/2595  Rate: 77  Rhythm: normal sinus rhythm  QRS Axis: normal  Intervals: normal  ST/T Wave abnormalities: nonspecific ST changes  Conduction Disutrbances:none  Narrative Interpretation:   Old EKG Reviewed: unchanged Since 01/14/12   1. Left ankle sprain   2. Avulsion  fracture of ankle   3. Alcohol intoxication       MDM  Patient with a complicated story coming in by EMS. Recently there was left ankle pain patient does have evidence of left ankle sprain and a small avulsion fracture to the very distal tip of the fibula. Also there was shortness of breath also there was alcohol intoxication. There was some mention of his chest hurting medicine not currently present. Not clear whether he passed out or not EMS thought perhaps he did. Patient admits to drinking 240 ounce beers and a couple glasses of wine today. Patient has history of alcohol abuse.  Will treat the ankle sprain with an ASO crutches and followup with orthopedics. Alcohol referral information provided. Patient's troponin is negative patient's EKG without any acute changes. Patient's chest x-rays negative labs without any significant findings other than the alcohol level of 310.    I personally performed the services described in this documentation, which was scribed in my presence. The recorded information has been reviewed and  is accurate.          Jesse Jakes, MD 05/21/12 [phone]

## 2012-05-22 ENCOUNTER — Encounter (HOSPITAL_COMMUNITY): Payer: Self-pay

## 2012-05-22 ENCOUNTER — Emergency Department (HOSPITAL_COMMUNITY)
Admission: EM | Admit: 2012-05-22 | Discharge: 2012-05-22 | Disposition: A | Discharge status: Home / Self Care | Payer: 59 | Attending: Emergency Medicine | Admitting: Emergency Medicine

## 2012-05-22 DIAGNOSIS — Z8673 Personal history of transient ischemic attack (TIA), and cerebral infarction without residual deficits: Secondary | ICD-10-CM | POA: Insufficient documentation

## 2012-05-22 DIAGNOSIS — F1010 Alcohol abuse, uncomplicated: Secondary | ICD-10-CM

## 2012-05-22 DIAGNOSIS — Z8709 Personal history of other diseases of the respiratory system: Secondary | ICD-10-CM | POA: Insufficient documentation

## 2012-05-22 DIAGNOSIS — Z862 Personal history of diseases of the blood and blood-forming organs and certain disorders involving the immune mechanism: Secondary | ICD-10-CM | POA: Insufficient documentation

## 2012-05-22 DIAGNOSIS — Z8659 Personal history of other mental and behavioral disorders: Secondary | ICD-10-CM | POA: Insufficient documentation

## 2012-05-22 DIAGNOSIS — Z79899 Other long term (current) drug therapy: Secondary | ICD-10-CM | POA: Insufficient documentation

## 2012-05-22 DIAGNOSIS — I10 Essential (primary) hypertension: Secondary | ICD-10-CM | POA: Insufficient documentation

## 2012-05-22 DIAGNOSIS — Z7982 Long term (current) use of aspirin: Secondary | ICD-10-CM | POA: Insufficient documentation

## 2012-05-22 DIAGNOSIS — F17200 Nicotine dependence, unspecified, uncomplicated: Secondary | ICD-10-CM | POA: Insufficient documentation

## 2012-05-22 LAB — CBC WITH DIFFERENTIAL/PLATELET
Basophils Absolute: 0.1 K/uL (ref 0.0–0.1)
Basophils Relative: 1 % (ref 0–1)
Eosinophils Absolute: 0.2 K/uL (ref 0.0–0.7)
Eosinophils Relative: 2 % (ref 0–5)
HCT: 49.1 % (ref 39.0–52.0)
Hemoglobin: 17.1 g/dL — ABNORMAL HIGH (ref 13.0–17.0)
Lymphocytes Relative: 32 % (ref 12–46)
Lymphs Abs: 3.1 K/uL (ref 0.7–4.0)
MCH: 31.4 pg (ref 26.0–34.0)
MCHC: 34.8 g/dL (ref 30.0–36.0)
MCV: 90.3 fL (ref 78.0–100.0)
Monocytes Absolute: 0.5 K/uL (ref 0.1–1.0)
Monocytes Relative: 6 % (ref 3–12)
Neutro Abs: 5.7 K/uL (ref 1.7–7.7)
Neutrophils Relative %: 60 % (ref 43–77)
Platelets: 230 K/uL (ref 150–400)
RBC: 5.44 MIL/uL (ref 4.22–5.81)
RDW: 13.4 % (ref 11.5–15.5)
WBC: 9.6 K/uL (ref 4.0–10.5)

## 2012-05-22 LAB — BASIC METABOLIC PANEL WITH GFR
BUN: 6 mg/dL (ref 6–23)
CO2: 27 meq/L (ref 19–32)
Calcium: 8.6 mg/dL (ref 8.4–10.5)
Chloride: 110 meq/L (ref 96–112)
Creatinine, Ser: 0.82 mg/dL (ref 0.50–1.35)
GFR calc Af Amer: >90 mL/min
GFR calc non Af Amer: >90 mL/min
Glucose, Bld: 120 mg/dL — ABNORMAL HIGH (ref 70–99)
Potassium: 3.7 meq/L (ref 3.5–5.1)
Sodium: 149 meq/L — ABNORMAL HIGH (ref 135–145)

## 2012-05-22 NOTE — ED Notes (Signed)
Patient states he needs to use the restroom but refuses to use the bedside commode or the urinal with nurses in the room to keep him from falling. RN aware. Patient was informed that he is unable to walk to the restroom. Patient states he wants to go home and his IV taken out.

## 2012-05-22 NOTE — ED Notes (Signed)
Pt arrived by ems from home, brother checked on pt this am, and he was intoxicated and not safe to leave at home. Was in er yesterday for fall, and wa snot given any meds for pain

## 2012-05-22 NOTE — ED Notes (Signed)
Pt found by ems surrounded by 40 oz bottles of colt 45, empty bottle of xanax, dec5th was filled 90 tabs.  Pt only moans and states "im ok when asked"

## 2012-05-22 NOTE — ED Notes (Signed)
Patient ripped IV out wanting to be discharged. RN aware. Patient called family member to come pick him up and family member states that they will not sign the patient out but will give the patient a ride home. RN aware.

## 2012-05-22 NOTE — ED Notes (Signed)
RN present with MD during re-assessment. Dr Adriana Simas stated patient is lucid enough to make medical decisions. Patients stated he wanted to be discharged home. Patient has family member at bedside to take patient home. Patient discharged at this time into care of family member per MD order. Patient left ED in NAD and with steady gait.

## 2012-05-22 NOTE — ED Provider Notes (Signed)
History   This chart was scribed for Jesse Hutching, MD by Gerlean Ren, ED Scribe. This patient was seen in room APA03/APA03 and the patient's care was started at 10:31 AM    CSN: [id number]  Arrival date & time 05/22/12  0944   First MD Initiated Contact with Patient 05/22/12 1012      Chief Complaint  Patient presents with  . Alcohol Intoxication     The history is provided by the patient. No language interpreter was used.   Jesse Stafford is a 56 y.o. male with h/o HTN, alcohols abuse, and CVA brought in by ambulance to the Emergency Department after brother noticed pt was EtOH intoxicated and not safe to leave at home.  Pt was seen here yesterday for right ankle pain after a fall while EtOH intoxicated and was not given pain medication.  Pt states he drank 3-4 beers last night.  Pt reports left ankle pain but is unable to create full sentences due to intoxication.   Past Medical History  Diagnosis Date  . Chest pain 2007    Associated with syncope in 2011  . Hypertension     Echo in 2011-mild LVH, hyperdynamic LV function, mild to moderate AI  . Alcohol abuse   . Tobacco abuse     20 pack years; 1/2 pack per day  . Thrombocytopenia     possible alcohol toxicity; 122,000  . Chronic sinusitis     By CT  . Chest pain   . Stroke   . Depression     History reviewed. No pertinent past surgical history.  Family History  Problem Relation Age of Onset  . Hypertension    . Alcohol abuse Neg Hx   . Coronary artery disease Mother 61    CABG  . Coronary artery disease Father 13    CABG    History  Substance Use Topics  . Smoking status: Current Every Day Smoker -- 1.0 packs/day for 40 years    Types: Cigarettes  . Smokeless tobacco: Never Used  . Alcohol Use: 15.0 oz/week    30 drink(s) per week     Comment: Multiple hospital admissions with chest pain and inebriation // states drinks 12 pck weekly      Review of Systems A complete 10 system review of systems was  obtained and all systems are negative except as noted in the HPI and PMH.   Allergies  Review of patient's allergies indicates no known allergies.  Home Medications   Current Outpatient Rx  Name  Route  Sig  Dispense  Refill  . ALBUTEROL SULFATE (2.5 MG/3ML) 0.083% IN NEBU   Nebulization   Take 2.5 mg by nebulization every 6 (six) hours as needed.         . ALPRAZOLAM 0.25 MG PO TABS   Oral   Take 0.25 mg by mouth every 8 (eight) hours as needed.         Marland Kitchen AMLODIPINE BESYLATE 10 MG PO TABS   Oral   Take 10 mg by mouth daily.         . ASPIRIN 325 MG PO TABS   Oral   Take 325 mg by mouth every other day. Take one tablet each day to prevent heart attacks.         . ATENOLOL 25 MG PO TABS   Oral   Take 25 mg by mouth daily. For hypertension.         Marland Kitchen CLONIDINE HCL 0.1  MG PO TABS   Oral   Take 0.1 mg by mouth 2 (two) times daily.         . TRAMADOL HCL 50 MG PO TABS   Oral   Take 1 tablet (50 mg total) by mouth every 6 (six) hours as needed for pain.   15 tablet   0     BP 162/103  Pulse 94  Temp 97.9 F (36.6 C) (Oral)  Resp 19  Wt 160 lb (72.576 kg)  SpO2 98%  Physical Exam  Nursing note and vitals reviewed. Constitutional: He appears well-developed and well-nourished.       Appears intoxicated, slurring words.  HENT:  Head: Normocephalic and atraumatic.  Eyes: Conjunctivae normal are normal.  Musculoskeletal:       Tender over left lateral malleolus, left ankle in brace.  Neurological:       Oriented to person, but not place or time.  Skin: Skin is warm and dry.  Psychiatric: He has a normal mood and affect.    ED Course  Procedures (including critical care time) DIAGNOSTIC STUDIES: Oxygen Saturation is 98% on room air, normal by my interpretation.    COORDINATION OF CARE: 10:39 AM- Pt left to sleep due to intoxication.  Ordered CBC, b-met, EtOH, urinalysis, and drug screen panel.  Results for orders placed during the hospital  encounter of 05/22/12  CBC WITH DIFFERENTIAL      Component Value Range   WBC 9.6  4.0 - 10.5 K/uL   RBC 5.44  4.22 - 5.81 MIL/uL   Hemoglobin 17.1 (*) 13.0 - 17.0 g/dL   HCT 81.1  91.4 - 78.2 %   MCV 90.3  78.0 - 100.0 fL   MCH 31.4  26.0 - 34.0 pg   MCHC 34.8  30.0 - 36.0 g/dL   RDW 95.6  21.3 - 08.6 %   Platelets 230  150 - 400 K/uL   Neutrophils Relative 60  43 - 77 %   Neutro Abs 5.7  1.7 - 7.7 K/uL   Lymphocytes Relative 32  12 - 46 %   Lymphs Abs 3.1  0.7 - 4.0 K/uL   Monocytes Relative 6  3 - 12 %   Monocytes Absolute 0.5  0.1 - 1.0 K/uL   Eosinophils Relative 2  0 - 5 %   Eosinophils Absolute 0.2  0.0 - 0.7 K/uL   Basophils Relative 1  0 - 1 %   Basophils Absolute 0.1  0.0 - 0.1 K/uL  BASIC METABOLIC PANEL      Component Value Range   Sodium 149 (*) 135 - 145 mEq/L   Potassium 3.7  3.5 - 5.1 mEq/L   Chloride 110  96 - 112 mEq/L   CO2 27  19 - 32 mEq/L   Glucose, Bld 120 (*) 70 - 99 mg/dL   BUN 6  6 - 23 mg/dL   Creatinine, Ser 5.78  0.50 - 1.35 mg/dL   Calcium 8.6  8.4 - 46.9 mg/dL   GFR calc non Af Amer >90  >90 mL/min   GFR calc Af Amer >90  >90 mL/min  ETHANOL      Component Value Range   Alcohol, Ethyl (B) 390 (*) 0 - 11 mg/dL    Dg Ankle Complete Left  05/21/2012  *RADIOLOGY REPORT*  Clinical Data: Larey Seat and injured left ankle.  Lateral pain and swelling.  LEFT ANKLE COMPLETE - 3+ VIEW  Comparison: None.  Findings: Avulsion fracture arising from the tip  the lateral malleolus.  No other fractures.  Ankle mortise intact with well- preserved joint space.  Bone mineral density well preserved.  Small ankle joint effusion/hemarthrosis.  Small enthesopathic spur at the insertion of the Achilles tendon on the posterior calcaneus.  IMPRESSION: Avulsion fracture arising from the tip of the lateral malleolus. No other fractures.   Original Report Authenticated By: Hulan Saas, M.D.      No diagnosis found.    MDM  Patient is ambulatory. He is lucid. No neuro  deficits.  No evidence of a head bleed I personally performed the services described in this documentation, which was scribed in my presence. The recorded information has been reviewed and is accurate.         Jesse Hutching, MD 05/22/12 [phone]

## 2012-05-22 NOTE — ED Notes (Signed)
Patient found attempting to get out of bed. Stating he needed to go to the restroom. Patient provided urinal x 2 prior, and RN explained to patient multiple times that he needed to urinate in the urinal. RN uncomfortable with patient walking to restroom due to recent history of fall and patient's confusion. Patient refusing to use urinal, started throwing urinal again. RN stopped patient. Patient laid back in bed stating "he quit" and was not going to use the restroom in the urinal. RN attempted to cover patient with blanket for comfort, patient grabbed blanket from RN forcefully and threw them across the room. Patient informed that throwing things is not acceptable in the ED. RN removed blankets from room.

## 2012-05-22 NOTE — ED Notes (Signed)
Patient stated he needed to use restroom, provided urinal. Patient threw urinal to floor after a minute.

## 2012-05-22 NOTE — ED Notes (Addendum)
After patient spoke with security and RN further, patient is agreeable to stay. Patient assisted back to bed. Side rails up x 2.

## 2012-05-22 NOTE — ED Notes (Signed)
Patient walking to nurses station stating he wanted to go home. Dr Adriana Simas aware and will be in to see patient for reassessment.

## 2012-05-22 NOTE — ED Notes (Signed)
Patient refusing to stay in bed. Stating he is going to leave. Dr Adriana Simas aware.

## 2012-05-22 NOTE — ED Notes (Signed)
Pt aroused during triage and able to answer simple ?'s, denies any si/hi, aware of his current location and stated he needs something for his pain.

## 2012-05-22 NOTE — ED Notes (Addendum)
Patient smells heavily of ETOH. EMS states 40 oz cans scattered throughout patient's home. Family concerned for patient safety and sent patient for well check. Confused. Only answers simple questions. Patient c/o foot pain.

## 2012-06-11 ENCOUNTER — Encounter: Payer: Self-pay | Admitting: *Deleted

## 2012-07-22 ENCOUNTER — Encounter (HOSPITAL_COMMUNITY): Payer: Self-pay | Admitting: Pharmacy Technician

## 2012-07-22 ENCOUNTER — Ambulatory Visit (INDEPENDENT_AMBULATORY_CARE_PROVIDER_SITE_OTHER): Payer: Medicaid Other | Admitting: Urgent Care

## 2012-07-22 ENCOUNTER — Encounter: Payer: Self-pay | Admitting: Urgent Care

## 2012-07-22 VITALS — BP 166/98 | HR 63 | Temp 98.6°F | Ht 67.0 in | Wt 171.8 lb

## 2012-07-22 DIAGNOSIS — K921 Melena: Secondary | ICD-10-CM

## 2012-07-22 DIAGNOSIS — R109 Unspecified abdominal pain: Secondary | ICD-10-CM | POA: Insufficient documentation

## 2012-07-22 DIAGNOSIS — F1010 Alcohol abuse, uncomplicated: Secondary | ICD-10-CM

## 2012-07-22 DIAGNOSIS — K219 Gastro-esophageal reflux disease without esophagitis: Secondary | ICD-10-CM | POA: Insufficient documentation

## 2012-07-22 MED ORDER — OMEPRAZOLE 20 MG PO CPDR: 20.0000 mg | DELAYED_RELEASE_CAPSULE | Freq: Every day | ORAL | Status: DC

## 2012-07-22 MED ORDER — PEG 3350-KCL-NA BICARB-NACL 420 G PO SOLR: 4000.0000 mL | ORAL | Status: DC

## 2012-07-22 NOTE — Assessment & Plan Note (Signed)
See upper ABD pain

## 2012-07-22 NOTE — Assessment & Plan Note (Addendum)
Jesse Stafford is a pleasant 57 y.o. male with chronic intermittent abdominal pain, heartburn, & indigestion with hx of ETOH abuse.  He reports quit drinking heavily 6 yrs ago.  Differentials include untreated GERD, gastritis, PUD, less likely chronic pancreatitis.  EGD with Dr Jena Gauss.   I have discussed risks & benefits which include, but are not limited to, bleeding, infection, perforation & drug reaction.  The patient agrees with this plan & written consent will be obtained.     1-800-QUIT-NOW for help quitting smoking CBC, LFTS, lipase Begin PRILOSEC 20mg  daily before breakfast for acid reflux

## 2012-07-22 NOTE — Assessment & Plan Note (Addendum)
Colonoscopy with Dr Jena Gauss to determine etiology of bleeding which includes benign anorectal source, diverticular bleeding, colon polyps or carcinoma. I have discussed risks & benefits which include, but are not limited to, bleeding, infection, perforation & drug reaction.  The patient agrees with this plan & written consent will be obtained.   Phenergan 25mg  IV 30 minutes prior to procedure to augment sedation given hx of alcohol abuse.

## 2012-07-22 NOTE — Progress Notes (Signed)
Faxed to PCP

## 2012-07-22 NOTE — Patient Instructions (Addendum)
1-800-QUIT-NOW for help quitting smoking Please get your labs as soon as possible.  We will call you with results. Colonoscopy & EGD with Dr Jena Gauss Begin PRILOSEC 20mg  daily before breakfast for acid reflux

## 2012-07-22 NOTE — Assessment & Plan Note (Signed)
Hx heavy ETOH, quit 6 yrs ago.  Still drinks several drinks per month.  ? underlying liver disease.  Labs as above.

## 2012-07-22 NOTE — Progress Notes (Signed)
Referring Provider: [facility] Dept Primary Care Physician:  Tylene Fantasia., PA Primary Gastroenterologist:  Dr. Jena Gauss  Chief Complaint  Patient presents with  . Rectal Bleeding  . Hemorrhoids    HPI:  Jesse Stafford is a 57 y.o. male here as a referral from [place], [place] for upper abd pain, hematochezia, & colonoscopy.  He has had intermittent upper abdominal pain for many years.  He attributes this to his hx of drinking ETOH heavily for 20 yrs.  He reports he quit drinking heavily 6 yrs ago, & now just drinks 6-8 beers twice per month.  C/o indigestion & heartburn a couple times per week.  He has tried samples of PPI but cannot remember name, & he did not get Rx filled because he couldn't afford.  He went to ER & had cardiac work-up that was negative.  Referred to cardiology but did not have insurance.  He plans on following up now that he has medicaid.  He has seen small to moderate amounts of bright red blood in the commode & on toilet paper when wiping.  The episodes are worse w/ heavy lifting.  He reports that he has soaked his pants before with blood with lifting.  Denies NSAIDs or headache powders.  Wt stable.  Good appetite.   Denies constipation, diarrhea, or weight loss.   Past Medical History  Diagnosis Date  . Chest pain 2007    Associated with syncope in 2011  . Hypertension     Echo in 2011-mild LVH, hyperdynamic LV function, mild to moderate AI  . Alcohol abuse   . Tobacco abuse     20 pack years; 1/2 pack per day  . Thrombocytopenia     possible alcohol toxicity; 122,000  . Chronic sinusitis     By CT  . Chest pain   . Stroke   . Depression   . Anxiety     No past surgical history on file.  Current Outpatient Prescriptions  Medication Sig Dispense Refill  . ALPRAZolam (XANAX) 0.25 MG tablet Take 0.25 mg by mouth every 8 (eight) hours as needed.      Marland Kitchen amLODipine (NORVASC) 10 MG tablet Take 10 mg by mouth daily.      Marland Kitchen atenolol (TENORMIN)  25 MG tablet Take 25 mg by mouth daily. For hypertension.      . cloNIDine (CATAPRES) 0.1 MG tablet Take 0.1 mg by mouth 2 (two) times daily.      Marland Kitchen omeprazole (PRILOSEC) 20 MG capsule Take 1 capsule (20 mg total) by mouth daily.  20 capsule  0  . polyethylene glycol-electrolytes (TRILYTE) 420 G solution Take 4,000 mLs by mouth as directed.  4000 mL  0   No current facility-administered medications for this visit.    Allergies as of 07/22/2012  . (No Known Allergies)    Family History  Problem Relation Age of Onset  . Hypertension    . Alcohol abuse Neg Hx   . Coronary artery disease Mother 58    CABG  . Coronary artery disease Father 9    CABG  . Colon cancer Father 46    History   Social History  . Marital Status: Single    Spouse Name: N/A    Number of Children: 0  . Years of Education: N/A   Occupational History  . disabled    Social History Main Topics  . Smoking status: Current Every Day Smoker -- 1.00 packs/day for 40 years  Types: Cigarettes  . Smokeless tobacco: Never Used  . Alcohol Use: 15.0 oz/week    30 drink(s) per week     Comment: Multiple hospital admissions with chest pain and inebriation // states drinks 12 pck weekly  . Drug Use: No  . Sexually Active: Not Currently   Other Topics Concern  . Not on file   Social History Narrative   Lives w/ brother Taegan Standage)    Review of Systems: Gen: Denies any fever, chills, sweats, anorexia, fatigue, weakness, malaise, weight loss, and sleep disorder CV: Denies chest pain, angina, palpitations, syncope, orthopnea, PND, peripheral edema, and claudication. Resp: Denies dyspnea at rest, dyspnea with exercise, cough, sputum, wheezing, coughing up blood, and pleurisy. GI: Denies vomiting blood, jaundice, and fecal incontinence.   Denies dysphagia or odynophagia. GU : Denies urinary burning, blood in urine, urinary frequency, urinary hesitancy, nocturnal urination, and urinary incontinence. MS:  Denies joint pain, limitation of movement, and swelling, stiffness, low back pain, extremity pain. Denies muscle weakness, cramps, atrophy.  Derm: Denies rash, itching, dry skin, hives, moles, warts, or unhealing ulcers.  Psych: +ANXIETY Denies depression, memory loss, suicidal ideation, hallucinations, paranoia, and confusion. Heme: Denies bruising or enlarged lymph nodes. Neuro:  Denies any headaches, dizziness, paresthesias. Endo:  Denies any problems with DM, thyroid, adrenal function.  Physical Exam: BP 166/98  Pulse 63  Temp(Src) 98.6 F (37 C) (Oral)  Ht 5\' 7"  (1.702 m)  Wt 171 lb 12.8 oz (77.928 kg)  BMI 26.9 kg/m2 No LMP for male patient. General:   Alert,  Well-developed, well-nourished, pleasant and cooperative in NAD Head:  Normocephalic and atraumatic. Eyes:  Sclera clear, no icterus.   Conjunctiva pink. Ears:  Normal auditory acuity. Nose:  No deformity, discharge, or lesions. Mouth:  No deformity or lesions,oropharynx pink & moist. Neck:  Supple; no masses or thyromegaly. Lungs:  Clear throughout to auscultation.   No wheezes, crackles, or rhonchi. No acute distress. Heart:  Regular rate and rhythm; no murmurs, clicks, rubs,  or gallops. Abdomen:  Normal bowel sounds.  No bruits.  Soft, non-tender and non-distended without masses, hepatosplenomegaly or hernias noted.  No guarding or rebound tenderness.   Rectal:  Deferred. Msk:  Symmetrical without gross deformities. Normal posture. Pulses:  Normal pulses noted. Extremities:  +clubbing.  No edema. Neurologic:  Alert and oriented x4;  grossly normal neurologically. Skin:  Intact without significant lesions or rashes. Lymph Nodes:  No significant cervical adenopathy. Psych:  Alert and cooperative. Normal mood and affect.

## 2012-07-24 LAB — CBC WITH DIFFERENTIAL/PLATELET
Basophils Relative: 1 % (ref 0–1)
Eosinophils Absolute: 0.1 10*3/uL (ref 0.0–0.7)
Eosinophils Relative: 2 % (ref 0–5)
Hemoglobin: 17 g/dL (ref 13.0–17.0)
Lymphs Abs: 1.9 10*3/uL (ref 0.7–4.0)
MCH: 31 pg (ref 26.0–34.0)
MCHC: 35.3 g/dL (ref 30.0–36.0)
MCV: 87.6 fL (ref 78.0–100.0)
Monocytes Relative: 10 % (ref 3–12)
Neutrophils Relative %: 61 % (ref 43–77)
Platelets: 197 10*3/uL (ref 150–400)
RBC: 5.49 MIL/uL (ref 4.22–5.81)

## 2012-07-24 LAB — HEPATIC FUNCTION PANEL
ALT: 26 U/L (ref 0–53)
Albumin: 4.3 g/dL (ref 3.5–5.2)
Alkaline Phosphatase: 85 U/L (ref 39–117)
Indirect Bilirubin: 0.4 mg/dL (ref 0.0–0.9)
Total Protein: 7.7 g/dL (ref 6.0–8.3)

## 2012-07-24 LAB — LIPASE: Lipase: 42 U/L (ref 0–75)

## 2012-07-25 NOTE — Progress Notes (Signed)
Quick Note:  Please let pt know blood ct, liver tests & pancreas test normal Keep EGD as planned ZO:[id number],[id number] D., PA  ______

## 2012-07-28 NOTE — Progress Notes (Signed)
Quick Note:  Pt is aware and is already scheduled for egd.  Dawn, please cc pcp ______

## 2012-07-29 NOTE — Progress Notes (Signed)
Faxed to PCP

## 2012-07-30 ENCOUNTER — Encounter (HOSPITAL_COMMUNITY)
Admission: RE | Disposition: A | Discharge status: Home / Self Care | Payer: Self-pay | Source: Ambulatory Visit | Attending: Internal Medicine

## 2012-07-30 ENCOUNTER — Ambulatory Visit (HOSPITAL_COMMUNITY)
Admission: RE | Admit: 2012-07-30 | Discharge: 2012-07-30 | Disposition: A | Discharge status: Home / Self Care | Payer: Medicaid Other | Source: Ambulatory Visit | Attending: Internal Medicine | Admitting: Internal Medicine

## 2012-07-30 ENCOUNTER — Encounter (HOSPITAL_COMMUNITY): Payer: Self-pay | Admitting: *Deleted

## 2012-07-30 DIAGNOSIS — K5730 Diverticulosis of large intestine without perforation or abscess without bleeding: Secondary | ICD-10-CM | POA: Insufficient documentation

## 2012-07-30 DIAGNOSIS — K2970 Gastritis, unspecified, without bleeding: Secondary | ICD-10-CM | POA: Insufficient documentation

## 2012-07-30 DIAGNOSIS — D126 Benign neoplasm of colon, unspecified: Secondary | ICD-10-CM | POA: Insufficient documentation

## 2012-07-30 DIAGNOSIS — R109 Unspecified abdominal pain: Secondary | ICD-10-CM

## 2012-07-30 DIAGNOSIS — K621 Rectal polyp: Secondary | ICD-10-CM

## 2012-07-30 DIAGNOSIS — K620 Anal polyp: Secondary | ICD-10-CM

## 2012-07-30 DIAGNOSIS — K921 Melena: Secondary | ICD-10-CM

## 2012-07-30 DIAGNOSIS — D128 Benign neoplasm of rectum: Secondary | ICD-10-CM | POA: Insufficient documentation

## 2012-07-30 DIAGNOSIS — K449 Diaphragmatic hernia without obstruction or gangrene: Secondary | ICD-10-CM | POA: Insufficient documentation

## 2012-07-30 DIAGNOSIS — K648 Other hemorrhoids: Secondary | ICD-10-CM | POA: Insufficient documentation

## 2012-07-30 HISTORY — PX: COLONOSCOPY WITH ESOPHAGOGASTRODUODENOSCOPY (EGD): SHX5779

## 2012-07-30 SURGERY — COLONOSCOPY WITH ESOPHAGOGASTRODUODENOSCOPY (EGD)
Anesthesia: Moderate Sedation

## 2012-07-30 MED ORDER — MIDAZOLAM HCL 5 MG/5ML IJ SOLN
INTRAMUSCULAR | Status: AC
  Filled 2012-07-30: qty 10

## 2012-07-30 MED ORDER — PROMETHAZINE HCL 25 MG/ML IJ SOLN
INTRAMUSCULAR | Status: AC
  Filled 2012-07-30: qty 1

## 2012-07-30 MED ORDER — MEPERIDINE HCL 100 MG/ML IJ SOLN
INTRAMUSCULAR | Status: DC | PRN
  Administered 2012-07-30 (×2): 50 mg via INTRAVENOUS

## 2012-07-30 MED ORDER — PROMETHAZINE HCL 25 MG/ML IJ SOLN
25.0000 mg | Freq: Once | INTRAMUSCULAR | Status: AC
Start: 2012-07-30 — End: 2012-07-30
  Administered 2012-07-30: 25 mg via INTRAVENOUS

## 2012-07-30 MED ORDER — MIDAZOLAM HCL 5 MG/5ML IJ SOLN
INTRAMUSCULAR | Status: DC | PRN
  Administered 2012-07-30: 2 mg via INTRAVENOUS
  Administered 2012-07-30 (×2): 1 mg via INTRAVENOUS
  Administered 2012-07-30: 2 mg via INTRAVENOUS

## 2012-07-30 MED ORDER — ONDANSETRON HCL 4 MG/2ML IJ SOLN
INTRAMUSCULAR | Status: AC
  Filled 2012-07-30: qty 2

## 2012-07-30 MED ORDER — SODIUM CHLORIDE 0.9 % IJ SOLN
INTRAMUSCULAR | Status: AC
  Filled 2012-07-30: qty 10

## 2012-07-30 MED ORDER — MEPERIDINE HCL 100 MG/ML IJ SOLN
INTRAMUSCULAR | Status: AC
  Filled 2012-07-30: qty 2

## 2012-07-30 MED ORDER — SODIUM CHLORIDE 0.45 % IV SOLN
INTRAVENOUS | Status: DC
  Administered 2012-07-30: 09:00:00 via INTRAVENOUS

## 2012-07-30 MED ORDER — STERILE WATER FOR IRRIGATION IR SOLN
Status: DC | PRN
  Administered 2012-07-30: 09:00:00

## 2012-07-30 MED ORDER — BUTAMBEN-TETRACAINE-BENZOCAINE 2-2-14 % EX AERO
INHALATION_SPRAY | CUTANEOUS | Status: DC | PRN
  Administered 2012-07-30: 2 via TOPICAL

## 2012-07-30 MED ORDER — ONDANSETRON HCL 4 MG/2ML IJ SOLN
INTRAMUSCULAR | Status: DC | PRN
  Administered 2012-07-30: 4 mg via INTRAVENOUS

## 2012-07-30 NOTE — Op Note (Signed)
[facility] [address] [place], [address]   ENDOSCOPY PROCEDURE REPORT  PATIENT: Jesse Stafford, Jesse Stafford  MR#: [id number] BIRTHDATE: 23-Oct-1955 , 56  yrs. old GENDER: Male ENDOSCOPIST: R.  Roetta Sessions, MD [facility] REFERRED BY:  Kizzie Furnish, [place] PROCEDURE DATE:  07/30/2012 PROCEDURE:     EGD with gastric and duodenal biopsy  INDICATIONS:     upper abdominal pain  INFORMED CONSENT:   The risks, benefits, limitations, alternatives and imponderables have been discussed.  The potential for biopsy, esophogeal dilation, etc. have also been reviewed.  Questions have been answered.  All parties agreeable.  Please see the history and physical in the medical record for more information.  MEDICATIONS:     Demerol 100 mg and Versed 4 mg IV and Zofran 4 mg IV and Phenergan 25 mg and Cetacaine spray  DESCRIPTION OF PROCEDURE:   The [id number] ([id number])  endoscope was introduced through the mouth and advanced to the second portion of the duodenum without difficulty or limitations.  The mucosal surfaces were surveyed very carefully during advancement of the scope and upon withdrawal.  Retroflexion view of the proximal stomach and esophagogastric junction was performed.      FINDINGS: Normal-appearing esophagus. No varices or Barrett's esophagus. Stomach empty. Small hiatal hernia. friable pyloric channel. No ulcer or infiltrating process. Examination of the bulb the second and third portion revealed some scalloping of the tip of folds.  THERAPEUTIC / DIAGNOSTIC MANEUVERS PERFORMED:  biopsies the gastric mucosa taken. Subsequently, biopsies of the second third portion of the duodenum taken.   COMPLICATIONS:  None  IMPRESSION:  Small hiatal hernia. Friable pyloric channel of uncertain significance - status post biopsy. Abnormal-appearing duodenal mucosa of uncertain significance-status post biopsy  RECOMMENDATIONS:    Followup on pathology. See  colonoscopy    _______________________________ R. Roetta Sessions, MD FACP [facility] eSigned:  R. Roetta Sessions, MD FACP [facility] 07/30/2012 9:53 AM     CC:

## 2012-07-30 NOTE — Interval H&P Note (Signed)
History and Physical Interval Note:  07/30/2012 9:22 AM  Doneta Public  has presented today for surgery, with the diagnosis of RECTAL BLEEDING AND GERD  The various methods of treatment have been discussed with the patient and family. After consideration of risks, benefits and other options for treatment, the patient has consented to  Procedure(s) with comments: COLONOSCOPY WITH ESOPHAGOGASTRODUODENOSCOPY (EGD) (N/A) - 9:30 as a surgical intervention .  The patient's history has been reviewed, patient examined, no change in status, stable for surgery.  I have reviewed the patient's chart and labs.  Questions were answered to the patient's satisfaction.     Aydenn Gervin  EGD and colonoscopy per plan.The risks, benefits, limitations, imponderables and alternatives regarding both EGD and colonoscopy have been reviewed with the patient. Questions have been answered. All parties agreeable.

## 2012-07-30 NOTE — H&P (View-Only) (Signed)
Referring Provider: [facility] Primary Care Physician:  MUSE,ROCHELLE D., PA Primary Gastroenterologist:  Dr. Rourk  Chief Complaint  Patient presents with  . Rectal Bleeding  . Hemorrhoids    HPI:  Jesse Stafford is a 56 y.o. male here as a referral from Rochelle Muse, PA for upper abd pain, hematochezia, & colonoscopy.  He has had intermittent upper abdominal pain for many years.  He attributes this to his hx of drinking ETOH heavily for 20 yrs.  He reports he quit drinking heavily 6 yrs ago, & now just drinks 6-8 beers twice per month.  C/o indigestion & heartburn a couple times per week.  He has tried samples of PPI but cannot remember name, & he did not get Rx filled because he couldn't afford.  He went to ER & had cardiac work-up that was negative.  Referred to cardiology but did not have insurance.  He plans on following up now that he has medicaid.  He has seen small to moderate amounts of bright red blood in the commode & on toilet paper when wiping.  The episodes are worse w/ heavy lifting.  He reports that he has soaked his pants before with blood with lifting.  Denies NSAIDs or headache powders.  Wt stable.  Good appetite.   Denies constipation, diarrhea, or weight loss.   Past Medical History  Diagnosis Date  . Chest pain 2007    Associated with syncope in 2011  . Hypertension     Echo in 2011-mild LVH, hyperdynamic LV function, mild to moderate AI  . Alcohol abuse   . Tobacco abuse     20 pack years; 1/2 pack per day  . Thrombocytopenia     possible alcohol toxicity; 122,000  . Chronic sinusitis     By CT  . Chest pain   . Stroke   . Depression   . Anxiety     No past surgical history on file.  Current Outpatient Prescriptions  Medication Sig Dispense Refill  . ALPRAZolam (XANAX) 0.25 MG tablet Take 0.25 mg by mouth every 8 (eight) hours as needed.      . amLODipine (NORVASC) 10 MG tablet Take 10 mg by mouth daily.      . atenolol (TENORMIN)  25 MG tablet Take 25 mg by mouth daily. For hypertension.      . cloNIDine (CATAPRES) 0.1 MG tablet Take 0.1 mg by mouth 2 (two) times daily.      . omeprazole (PRILOSEC) 20 MG capsule Take 1 capsule (20 mg total) by mouth daily.  20 capsule  0  . polyethylene glycol-electrolytes (TRILYTE) 420 G solution Take 4,000 mLs by mouth as directed.  4000 mL  0   No current facility-administered medications for this visit.    Allergies as of 07/22/2012  . (No Known Allergies)    Family History  Problem Relation Age of Onset  . Hypertension    . Alcohol abuse Neg Hx   . Coronary artery disease Mother 65    CABG  . Coronary artery disease Father 62    CABG  . Colon cancer Father 60    History   Social History  . Marital Status: Single    Spouse Name: N/A    Number of Children: 0  . Years of Education: N/A   Occupational History  . disabled    Social History Main Topics  . Smoking status: Current Every Day Smoker -- 1.00 packs/day for 40 years      Types: Cigarettes  . Smokeless tobacco: Never Used  . Alcohol Use: 15.0 oz/week    30 drink(s) per week     Comment: Multiple hospital admissions with chest pain and inebriation // states drinks 12 pck weekly  . Drug Use: No  . Sexually Active: Not Currently   Other Topics Concern  . Not on file   Social History Narrative   Lives w/ brother (Royce Zingaro)    Review of Systems: Gen: Denies any fever, chills, sweats, anorexia, fatigue, weakness, malaise, weight loss, and sleep disorder CV: Denies chest pain, angina, palpitations, syncope, orthopnea, PND, peripheral edema, and claudication. Resp: Denies dyspnea at rest, dyspnea with exercise, cough, sputum, wheezing, coughing up blood, and pleurisy. GI: Denies vomiting blood, jaundice, and fecal incontinence.   Denies dysphagia or odynophagia. GU : Denies urinary burning, blood in urine, urinary frequency, urinary hesitancy, nocturnal urination, and urinary incontinence. MS:  Denies joint pain, limitation of movement, and swelling, stiffness, low back pain, extremity pain. Denies muscle weakness, cramps, atrophy.  Derm: Denies rash, itching, dry skin, hives, moles, warts, or unhealing ulcers.  Psych: +ANXIETY Denies depression, memory loss, suicidal ideation, hallucinations, paranoia, and confusion. Heme: Denies bruising or enlarged lymph nodes. Neuro:  Denies any headaches, dizziness, paresthesias. Endo:  Denies any problems with DM, thyroid, adrenal function.  Physical Exam: BP 166/98  Pulse 63  Temp(Src) 98.6 F (37 C) (Oral)  Ht 5' 7" (1.702 m)  Wt 171 lb 12.8 oz (77.928 kg)  BMI 26.9 kg/m2 No LMP for male patient. General:   Alert,  Well-developed, well-nourished, pleasant and cooperative in NAD Head:  Normocephalic and atraumatic. Eyes:  Sclera clear, no icterus.   Conjunctiva pink. Ears:  Normal auditory acuity. Nose:  No deformity, discharge, or lesions. Mouth:  No deformity or lesions,oropharynx pink & moist. Neck:  Supple; no masses or thyromegaly. Lungs:  Clear throughout to auscultation.   No wheezes, crackles, or rhonchi. No acute distress. Heart:  Regular rate and rhythm; no murmurs, clicks, rubs,  or gallops. Abdomen:  Normal bowel sounds.  No bruits.  Soft, non-tender and non-distended without masses, hepatosplenomegaly or hernias noted.  No guarding or rebound tenderness.   Rectal:  Deferred. Msk:  Symmetrical without gross deformities. Normal posture. Pulses:  Normal pulses noted. Extremities:  +clubbing.  No edema. Neurologic:  Alert and oriented x4;  grossly normal neurologically. Skin:  Intact without significant lesions or rashes. Lymph Nodes:  No significant cervical adenopathy. Psych:  Alert and cooperative. Normal mood and affect.  

## 2012-07-30 NOTE — Op Note (Signed)
[facility] [address] [place], [address]   COLONOSCOPY PROCEDURE REPORT  PATIENT: Jesse Stafford, Jesse Stafford  MR#:         [id number] BIRTHDATE: [birth year] , 56  yrs. old GENDER: Male ENDOSCOPIST: R.  Roetta Sessions, MD [facility] REFERRED BY:  Kizzie Furnish, [place] PROCEDURE DATE:  07/30/2012 PROCEDURE:     Colonoscopy with multiple snare polypectomies  INDICATIONS: hematochezia  INFORMED CONSENT:  The risks, benefits, alternatives and imponderables including but not limited to bleeding, perforation as well as the possibility of a missed lesion have been reviewed.  The potential for biopsy, lesion removal, etc. have also been discussed.  Questions have been answered.  All parties agreeable. Please see the history and physical in the medical record for more information.  MEDICATIONS: Versed 6 mg IV and Demerol 100 mg IV and Zofran 4 mg IV and Phenergan 25 mg IV  DESCRIPTION OF PROCEDURE:  After a digital rectal exam was performed, the Pentax Colonoscope [id number]  colonoscope was advanced from the anus through the rectum and colon to the area of the cecum, ileocecal valve and appendiceal orifice.  The cecum was deeply intubated.  These structures were well-seen and photographed for the record.  From the level of the cecum and ileocecal valve, the scope was slowly and cautiously withdrawn.  The mucosal surfaces were carefully surveyed utilizing scope tip deflection to facilitate fold flattening as needed.  The scope was pulled down into the rectum where a thorough examination including retroflexion was performed.    FINDINGS:  adequate preparation. Internal hemorrhoids. (1) 8 mm polyp in the rectum at 8 cm the anal verge; otherwise, normal rectum. Pancolonic diverticulosis (5) 5 - 10 mm pedunculated polyps in the sigmoid segment; (1) 6 mm  polyp just distal to the ileocecal valve in the ascending segment. The remainder of the colon appeared normal.  THERAPEUTIC /  DIAGNOSTIC MANEUVERS PERFORMED:  The above-mentioned polyps were hot snare removed and recovered for the pathologist  COMPLICATIONS: None  CECAL WITHDRAWAL TIME:  24 minutes  IMPRESSION:  Multiple rectal and colonic polyps-removed as described above. Internal hemorrhoids. Pancolonic diverticulosis  RECOMMENDATIONS: Followup on pathology. Ten-day course of Anusol suppositories. See EGD report.   _______________________________ eSigned:  R. Roetta Sessions, MD FACP [facility] 07/30/2012 10:39 AM   CC:    PATIENT NAME:  Jesse Stafford, Jesse Stafford MR#: [id number]

## 2012-08-01 ENCOUNTER — Encounter: Payer: Self-pay | Admitting: Internal Medicine

## 2012-08-04 ENCOUNTER — Encounter (HOSPITAL_COMMUNITY): Payer: Self-pay | Admitting: Internal Medicine

## 2012-08-04 ENCOUNTER — Encounter: Payer: Self-pay | Admitting: *Deleted

## 2012-08-07 ENCOUNTER — Encounter: Payer: Self-pay | Admitting: *Deleted

## 2012-08-07 ENCOUNTER — Ambulatory Visit (INDEPENDENT_AMBULATORY_CARE_PROVIDER_SITE_OTHER): Payer: Medicaid Other | Admitting: Cardiovascular Disease

## 2012-08-07 ENCOUNTER — Encounter: Payer: Self-pay | Admitting: Cardiovascular Disease

## 2012-08-07 VITALS — BP 140/82 | HR 64 | Ht 67.0 in | Wt 171.0 lb

## 2012-08-07 DIAGNOSIS — R079 Chest pain, unspecified: Secondary | ICD-10-CM

## 2012-08-07 NOTE — Assessment & Plan Note (Signed)
?   HTN continue bp meds and ASA no motor deficits

## 2012-08-07 NOTE — Assessment & Plan Note (Signed)
Well controlled.  Continue current medications and low sodium Dash type diet.    

## 2012-08-07 NOTE — Assessment & Plan Note (Signed)
Atypica but smoker with history of stroke  Unable to ambulate well due to stroke and broken foot  F/U lexiscan myovue

## 2012-08-07 NOTE — Assessment & Plan Note (Signed)
Counseled for less than 10 minutes Little motivation to quit Discussed nicotine replacement and E-cig

## 2012-08-07 NOTE — Patient Instructions (Addendum)
Your physician recommends that you schedule a follow-up appointment in: AS NEEDED  Your physician has requested that you have a lexiscan myoview. For further information please visit [email or web address]. Please follow instruction sheet, as given.    

## 2012-08-07 NOTE — Progress Notes (Signed)
Patient ID: Jesse Stafford, male   DOB: September 20, 1955, 57 y.o.   MRN: [id number] 57 yo referred by [facility] for chest pain.  Patient does not take care of himself.  He drinks, smokes.  Has had CVA last year with right sided paresthesias.  Activity limited by broken left foot and dyspnea.  No documented CAD Has HTN and intermitantly non compliant with meds.  SSCP over the last 2 years. Not always exertional. Sharp left sided lasts less than 5 minutes Use to have a nitro patch but this was stopped.  No stress testing done.  Pain is persistant but not worse last few months. No alleviating factors.   ROS: Denies fever, malais, weight loss, blurry vision, decreased visual acuity, cough, sputum, SOB, hemoptysis, pleuritic pain, palpitaitons, heartburn, abdominal pain, melena, lower extremity edema, claudication, or rash.  All other systems reviewed and negative   General: Affect appropriate Healthy:  appears stated age HEENT: normal Neck supple with no adenopathy JVP normal no bruits no thyromegaly Lungs clear with no wheezing and good diaphragmatic motion Heart:  S1/S2 soft SEM murmur,rub, gallop or click PMI normal Abdomen: benighn, BS positve, no tenderness, no AAA no bruit.  No HSM or HJR Distal pulses intact with no bruits No edema Neuro non-focal Skin warm and dry No muscular weakness  Medications Current Outpatient Prescriptions  Medication Sig Dispense Refill  . amLODipine (NORVASC) 10 MG tablet Take 10 mg by mouth daily.      Marland Kitchen atenolol (TENORMIN) 50 MG tablet Take 50 mg by mouth daily. Increased from 25 mg per PCP on 07/28/12      . cloNIDine (CATAPRES) 0.1 MG tablet Take 0.1 mg by mouth 2 (two) times daily.      Marland Kitchen omeprazole (PRILOSEC) 20 MG capsule Take 1 capsule (20 mg total) by mouth daily.  20 capsule  0   No current facility-administered medications for this visit.    Allergies Review of patient's allergies indicates no known allergies.  Family History: Family History   Problem Relation Age of Onset  . Hypertension    . Alcohol abuse Neg Hx   . Coronary artery disease Mother 31    CABG  . Coronary artery disease Father 13    CABG  . Colon cancer Father 71    Social History: History   Social History  . Marital Status: Single    Spouse Name: N/A    Number of Children: 0  . Years of Education: N/A   Occupational History  . disabled    Social History Main Topics  . Smoking status: Current Every Day Smoker -- 1.00 packs/day for 40 years    Types: Cigarettes  . Smokeless tobacco: Never Used  . Alcohol Use: 15.0 oz/week    30 drink(s) per week     Comment: Multiple hospital admissions with chest pain and inebriation // states drinks 12 pck weekly  . Drug Use: No  . Sexually Active: Not Currently   Other Topics Concern  . Not on file   Social History Narrative   Lives w/ brother [name])    Electrocardiogram:  NSR rate 61 nonspecific inferior T wave changes 06/10/12  Assessment and Plan

## 2012-08-15 ENCOUNTER — Encounter (HOSPITAL_COMMUNITY): Payer: Medicaid Other

## 2012-08-15 ENCOUNTER — Inpatient Hospital Stay (HOSPITAL_COMMUNITY): Admission: RE | Admit: 2012-08-15 | Payer: Medicaid Other | Source: Ambulatory Visit

## 2012-08-17 ENCOUNTER — Encounter (HOSPITAL_COMMUNITY): Payer: Self-pay

## 2012-08-17 ENCOUNTER — Emergency Department (HOSPITAL_COMMUNITY): Payer: MEDICAID

## 2012-08-17 ENCOUNTER — Emergency Department (HOSPITAL_COMMUNITY)
Admission: EM | Admit: 2012-08-17 | Discharge: 2012-08-17 | Disposition: A | Discharge status: Home / Self Care | Payer: MEDICAID | Attending: Emergency Medicine | Admitting: Emergency Medicine

## 2012-08-17 DIAGNOSIS — F17200 Nicotine dependence, unspecified, uncomplicated: Secondary | ICD-10-CM | POA: Insufficient documentation

## 2012-08-17 DIAGNOSIS — F3289 Other specified depressive episodes: Secondary | ICD-10-CM | POA: Insufficient documentation

## 2012-08-17 DIAGNOSIS — Z8709 Personal history of other diseases of the respiratory system: Secondary | ICD-10-CM | POA: Insufficient documentation

## 2012-08-17 DIAGNOSIS — F411 Generalized anxiety disorder: Secondary | ICD-10-CM | POA: Insufficient documentation

## 2012-08-17 DIAGNOSIS — Z8673 Personal history of transient ischemic attack (TIA), and cerebral infarction without residual deficits: Secondary | ICD-10-CM | POA: Insufficient documentation

## 2012-08-17 DIAGNOSIS — Z8679 Personal history of other diseases of the circulatory system: Secondary | ICD-10-CM | POA: Insufficient documentation

## 2012-08-17 DIAGNOSIS — R062 Wheezing: Secondary | ICD-10-CM | POA: Insufficient documentation

## 2012-08-17 DIAGNOSIS — Z79899 Other long term (current) drug therapy: Secondary | ICD-10-CM | POA: Insufficient documentation

## 2012-08-17 DIAGNOSIS — R109 Unspecified abdominal pain: Secondary | ICD-10-CM

## 2012-08-17 DIAGNOSIS — Z7982 Long term (current) use of aspirin: Secondary | ICD-10-CM | POA: Insufficient documentation

## 2012-08-17 DIAGNOSIS — I10 Essential (primary) hypertension: Secondary | ICD-10-CM | POA: Insufficient documentation

## 2012-08-17 DIAGNOSIS — F1010 Alcohol abuse, uncomplicated: Secondary | ICD-10-CM | POA: Insufficient documentation

## 2012-08-17 DIAGNOSIS — R0602 Shortness of breath: Secondary | ICD-10-CM | POA: Insufficient documentation

## 2012-08-17 DIAGNOSIS — Z862 Personal history of diseases of the blood and blood-forming organs and certain disorders involving the immune mechanism: Secondary | ICD-10-CM | POA: Insufficient documentation

## 2012-08-17 DIAGNOSIS — F32A Depression, unspecified: Secondary | ICD-10-CM

## 2012-08-17 DIAGNOSIS — Z9889 Other specified postprocedural states: Secondary | ICD-10-CM | POA: Insufficient documentation

## 2012-08-17 LAB — CBC
HCT: 52.6 % — ABNORMAL HIGH (ref 39.0–52.0)
Hemoglobin: 18.6 g/dL — ABNORMAL HIGH (ref 13.0–17.0)
MCH: 31.1 pg (ref 26.0–34.0)
MCHC: 35.4 g/dL (ref 30.0–36.0)
MCV: 88 fL (ref 78.0–100.0)
Platelets: 208 10*3/uL (ref 150–400)
RBC: 5.98 MIL/uL — ABNORMAL HIGH (ref 4.22–5.81)
RDW: 13.2 % (ref 11.5–15.5)
WBC: 6 10*3/uL (ref 4.0–10.5)

## 2012-08-17 LAB — COMPREHENSIVE METABOLIC PANEL
ALT: 40 U/L (ref 0–53)
AST: 44 U/L — ABNORMAL HIGH (ref 0–37)
Albumin: 4.3 g/dL (ref 3.5–5.2)
Alkaline Phosphatase: 108 U/L (ref 39–117)
BUN: 7 mg/dL (ref 6–23)
CO2: 26 mEq/L (ref 19–32)
Calcium: 9 mg/dL (ref 8.4–10.5)
Chloride: 109 mEq/L (ref 96–112)
Creatinine, Ser: 0.7 mg/dL (ref 0.50–1.35)
GFR calc Af Amer: >90 mL/min (ref >90)
GFR calc non Af Amer: >90 mL/min (ref >90)
Glucose, Bld: 107 mg/dL — ABNORMAL HIGH (ref 70–99)
Potassium: 4.4 mEq/L (ref 3.5–5.1)
Sodium: 146 mEq/L — ABNORMAL HIGH (ref 135–145)
Total Bilirubin: 0.2 mg/dL — ABNORMAL LOW (ref 0.3–1.2)
Total Protein: 9 g/dL — ABNORMAL HIGH (ref 6.0–8.3)

## 2012-08-17 LAB — URINALYSIS, ROUTINE W REFLEX MICROSCOPIC
Bilirubin Urine: NEGATIVE
Glucose, UA: NEGATIVE mg/dL
Ketones, ur: NEGATIVE mg/dL
Leukocytes, UA: NEGATIVE
Nitrite: NEGATIVE
Protein, ur: NEGATIVE mg/dL
Specific Gravity, Urine: <1.005 — ABNORMAL LOW (ref 1.005–1.030)
Urobilinogen, UA: 0.2 mg/dL (ref 0.0–1.0)
pH: 6 (ref 5.0–8.0)

## 2012-08-17 LAB — RAPID URINE DRUG SCREEN, HOSP PERFORMED
Amphetamines: NOT DETECTED
Barbiturates: NOT DETECTED
Benzodiazepines: NOT DETECTED
Cocaine: NOT DETECTED
Opiates: NOT DETECTED
Tetrahydrocannabinol: NOT DETECTED

## 2012-08-17 LAB — ETHANOL: Alcohol, Ethyl (B): 266 mg/dL — ABNORMAL HIGH (ref 0–11)

## 2012-08-17 LAB — TROPONIN I: Troponin I: <0.3 ng/mL (ref <0.30)

## 2012-08-17 LAB — LIPASE, BLOOD: Lipase: 119 U/L — ABNORMAL HIGH (ref 11–59)

## 2012-08-17 MED ORDER — SODIUM CHLORIDE 0.9 % IV BOLUS (SEPSIS)
1000.0000 mL | Freq: Once | INTRAVENOUS | Status: AC
  Administered 2012-08-17: 1000 mL via INTRAVENOUS

## 2012-08-17 MED ORDER — VITAMIN B-1 100 MG PO TABS
100.0000 mg | ORAL_TABLET | Freq: Once | ORAL | Status: DC
  Filled 2012-08-17: qty 1

## 2012-08-17 MED ORDER — IOHEXOL 300 MG/ML  SOLN: 100.0000 mL | Freq: Once | INTRAMUSCULAR | Status: DC | PRN

## 2012-08-17 MED ORDER — FENTANYL CITRATE 0.05 MG/ML IJ SOLN
50.0000 ug | Freq: Once | INTRAMUSCULAR | Status: DC
  Filled 2012-08-17: qty 2

## 2012-08-17 MED ORDER — FOLIC ACID 1 MG PO TABS
1.0000 mg | ORAL_TABLET | Freq: Once | ORAL | Status: DC
  Filled 2012-08-17: qty 1

## 2012-08-17 MED ORDER — IOHEXOL 300 MG/ML  SOLN: 50.0000 mL | Freq: Once | INTRAMUSCULAR | Status: DC | PRN

## 2012-08-17 NOTE — ED Provider Notes (Signed)
History    This chart was scribed for Raeford Razor, MD by Charolett Bumpers, ED Scribe. The patient was seen in room APA06/APA06. Patient's care was started at 0759.   CSN: [id number]  Arrival date & time 08/17/12  0754   First MD Initiated Contact with Patient 08/17/12 [phone]      Chief Complaint  Patient presents with  . Rectal Bleeding   Level V Caveat: Poor historian  The history is provided by the patient. No language interpreter was used.   Jesse Stafford is a 57 y.o. male who presents to the Emergency Department via EMS complaining of rectal bleeding. He noticed bright red blood from his rectum. He has a h/o hemorrhoids for the past 5 years but is unable to state when the most recent episodes of bleeding started. He states that he feels terrible and complains of diffuse pain. He states that he has severe abdominal pain, but has had problems over the past 2 months and has gradually worsened. He also complains of SOB. Upon EMS arrival, pt smells of ETOH, but reports only drinking 2 beers last night. He has a h/o alcohol abuse and drinks daily.    Past Medical History  Diagnosis Date  . Chest pain 2007    Associated with syncope in 2011  . Hypertension     Echo in 2011-mild LVH, hyperdynamic LV function, mild to moderate AI  . Alcohol abuse   . Tobacco abuse     20 pack years; 1/2 pack per day  . Thrombocytopenia     possible alcohol toxicity; 122,000  . Chronic sinusitis     By CT  . Chest pain   . Stroke   . Depression   . Anxiety     Past Surgical History  Procedure Laterality Date  . Colonscopy    . Colonoscopy with esophagogastroduodenoscopy (egd) N/A 07/30/2012    Procedure: COLONOSCOPY WITH ESOPHAGOGASTRODUODENOSCOPY (EGD);  Surgeon: Corbin Ade, MD;  Location: [facility];  Service: Endoscopy;  Laterality: N/A;  9:30    Family History  Problem Relation Age of Onset  . Hypertension    . Alcohol abuse Neg Hx   . Coronary artery disease Mother 71     CABG  . Coronary artery disease Father 12    CABG  . Colon cancer Father 55    History  Substance Use Topics  . Smoking status: Current Every Day Smoker -- 1.00 packs/day for 40 years    Types: Cigarettes  . Smokeless tobacco: Never Used  . Alcohol Use: 15.0 oz/week    30 drink(s) per week     Comment: Multiple hospital admissions with chest pain and inebriation // states drinks 12 pck weekly      Review of Systems  Unable to perform ROS: Other  Poor historian  Allergies  Review of patient's allergies indicates no known allergies.  Home Medications   Current Outpatient Rx  Name  Route  Sig  Dispense  Refill  . amLODipine (NORVASC) 10 MG tablet   Oral   Take 10 mg by mouth daily.         Marland Kitchen atenolol (TENORMIN) 50 MG tablet   Oral   Take 50 mg by mouth daily. Increased from 25 mg per PCP on 07/28/12         . cloNIDine (CATAPRES) 0.1 MG tablet   Oral   Take 0.1 mg by mouth 2 (two) times daily.         Marland Kitchen  omeprazole (PRILOSEC) 20 MG capsule   Oral   Take 1 capsule (20 mg total) by mouth daily.   20 capsule   0     BP 156/82  Pulse 79  Temp(Src) 96.8 F (36 C) (Rectal)  Resp 17  SpO2 92%  Physical Exam  Nursing note and vitals reviewed. Constitutional: He appears well-developed and well-nourished. He appears ill. No distress.  Tired and ill appearing.   HENT:  Head: Normocephalic and atraumatic.  Eyes: Conjunctivae and EOM are normal. Right eye exhibits no discharge. Left eye exhibits no discharge.  Neck: Neck supple.  Cardiovascular: Normal rate, regular rhythm and normal heart sounds.  Exam reveals no gallop and no friction rub.   No murmur heard. Pulmonary/Chest: Effort normal. No respiratory distress. He has wheezes.  Poor air movement. Moderate wheezes bilaterally.   Abdominal: Soft. He exhibits distension. There is tenderness. There is guarding. There is no rebound.  Diffuse abdominal tenderness. Voluntary guarding, no rebound, slight  distension.   Genitourinary:  Non-thrombus bleeding external hemorrhoid.  Musculoskeletal: He exhibits no edema and no tenderness.  Neurological: He is alert.  Skin: Skin is warm and dry.  Psychiatric: He has a normal mood and affect. His behavior is normal. Thought content normal.    ED Course  Procedures (including critical care time)  DIAGNOSTIC STUDIES: Oxygen Saturation is 92% on room air, adequate by my interpretation.    COORDINATION OF CARE:  08:20-Discussed planned course of treatment with the patient including CT of abdomen, UA and blood work, who is agreeable at this time. Will also order ethanol screen.   08:30-Medication Orders: Sodium chloride 0.9% bolus 1,000 mL-once; Thiamine (Vitamin B-1) tablet 100 mg-once; Folic acid (Flovite) tablet 1 mg-once; Fentanyl (Sublimaze) injection 50 mcg-once.   Labs Reviewed - No data to display No results found.   1. ETOH abuse   2. Abdominal pain, unspecified site   3. Depression   4. External hemorrhoid    MDM  57 year old male with abdominal pain. Possibly history this. Low suspicion for acute abdomen. Patient abuses alcohol. Currently does not want detox her help in assisting him to quit. Does admit to depression. Initially said he is having suicidal thoughts, but currently is denying. He says he is just fed up and frustrated with stressors in his life including his alcohol abuse and financial constraints. He is not psychotic. He has no homicidal ideation. Offered to have a member of the ACT team  speak with them, but is declining. I feel he is medically safe for discharge at this time. Feel that he psychiatrically stable as well. Patient was provided with a resource list.  I personally preformed the services scribed in my presence. The recorded information has been reviewed is accurate. Raeford Razor, MD.     Raeford Razor, MD 08/21/12 [phone]

## 2012-08-17 NOTE — ED Notes (Signed)
Pt is refusing redraw for labs, will not drink contrast, keeps threatening to pull IV, and is refusing medications.

## 2012-08-17 NOTE — ED Notes (Signed)
Per ems, pt smelled of ETOH upon their arrival.  Pt reports drinking last night but noticed a lot of bright red blood from his rectum.  Pt reports hx of hemorrhoids.

## 2012-08-17 NOTE — ED Notes (Signed)
Pt pulled iv out of left hand, pt states he wants to go home, EDP made aware and has spoken with pt again, pt denies SI at this time, stated that what he said earlier "was bullshit"

## 2012-08-17 NOTE — ED Notes (Signed)
Charge nurse in to talk with pt. Pt states he wants to go home. That he will call his cousin to come pick him up. States his chest hurts and he is suppose to have a work up for the pain. Pt states if he goes home he is going to end it all because he is does not want to live. Pt intoxicated at this time. States he went to [facility] for his drinking problem and they really helped him. States he did not drink for four months after he got out. Pt made aware that we cannot let him leave if he want to harm himself.

## 2012-08-17 NOTE — ED Notes (Signed)
Pt reassigned to [facility] room 15 for suicide precautions.

## 2012-08-22 ENCOUNTER — Encounter (HOSPITAL_COMMUNITY): Payer: Self-pay | Admitting: *Deleted

## 2012-08-22 ENCOUNTER — Emergency Department (HOSPITAL_COMMUNITY)
Admission: EM | Admit: 2012-08-22 | Discharge: 2012-08-23 | Disposition: A | Discharge status: Home / Self Care | Payer: MEDICAID | Source: Home / Self Care | Attending: Emergency Medicine | Admitting: Emergency Medicine

## 2012-08-22 ENCOUNTER — Emergency Department (HOSPITAL_COMMUNITY): Payer: MEDICAID

## 2012-08-22 DIAGNOSIS — F411 Generalized anxiety disorder: Secondary | ICD-10-CM | POA: Insufficient documentation

## 2012-08-22 DIAGNOSIS — Z8673 Personal history of transient ischemic attack (TIA), and cerebral infarction without residual deficits: Secondary | ICD-10-CM | POA: Insufficient documentation

## 2012-08-22 DIAGNOSIS — F17200 Nicotine dependence, unspecified, uncomplicated: Secondary | ICD-10-CM | POA: Insufficient documentation

## 2012-08-22 DIAGNOSIS — Z862 Personal history of diseases of the blood and blood-forming organs and certain disorders involving the immune mechanism: Secondary | ICD-10-CM | POA: Insufficient documentation

## 2012-08-22 DIAGNOSIS — F1010 Alcohol abuse, uncomplicated: Secondary | ICD-10-CM | POA: Insufficient documentation

## 2012-08-22 DIAGNOSIS — Z79899 Other long term (current) drug therapy: Secondary | ICD-10-CM | POA: Insufficient documentation

## 2012-08-22 DIAGNOSIS — R51 Headache: Secondary | ICD-10-CM | POA: Insufficient documentation

## 2012-08-22 DIAGNOSIS — R45851 Suicidal ideations: Secondary | ICD-10-CM | POA: Insufficient documentation

## 2012-08-22 DIAGNOSIS — Z7982 Long term (current) use of aspirin: Secondary | ICD-10-CM | POA: Insufficient documentation

## 2012-08-22 DIAGNOSIS — F329 Major depressive disorder, single episode, unspecified: Secondary | ICD-10-CM | POA: Insufficient documentation

## 2012-08-22 DIAGNOSIS — I10 Essential (primary) hypertension: Secondary | ICD-10-CM | POA: Insufficient documentation

## 2012-08-22 DIAGNOSIS — F3289 Other specified depressive episodes: Secondary | ICD-10-CM | POA: Insufficient documentation

## 2012-08-22 LAB — CBC WITH DIFFERENTIAL/PLATELET
Basophils Absolute: 0.1 10*3/uL (ref 0.0–0.1)
Basophils Relative: 1 % (ref 0–1)
Eosinophils Absolute: 0.2 10*3/uL (ref 0.0–0.7)
HCT: 50.6 % (ref 39.0–52.0)
MCHC: 35.4 g/dL (ref 30.0–36.0)
Monocytes Absolute: 0.4 10*3/uL (ref 0.1–1.0)
Neutro Abs: 3.9 10*3/uL (ref 1.7–7.7)
RDW: 13.5 % (ref 11.5–15.5)

## 2012-08-22 LAB — RAPID URINE DRUG SCREEN, HOSP PERFORMED
Amphetamines: NOT DETECTED
Benzodiazepines: NOT DETECTED
Cocaine: NOT DETECTED
Opiates: NOT DETECTED
Tetrahydrocannabinol: NOT DETECTED

## 2012-08-22 LAB — COMPREHENSIVE METABOLIC PANEL
AST: 36 U/L (ref 0–37)
Albumin: 4.1 g/dL (ref 3.5–5.2)
Calcium: 9.2 mg/dL (ref 8.4–10.5)
Chloride: 102 mEq/L (ref 96–112)
Creatinine, Ser: 0.75 mg/dL (ref 0.50–1.35)
Total Protein: 8.5 g/dL — ABNORMAL HIGH (ref 6.0–8.3)

## 2012-08-22 NOTE — ED Notes (Addendum)
Pt brought in by [facility]. They state that pt called them saying he was depressed, doesn't want to live, and wants to jump out of a plane. Has no power. Gets food stamps. Neighbors brought him 8 beers tonight said he would have drank 20.    Pt states he has been depressed for 1.5 years and does not feel like his depression meds are helping. Pt has not taken any of his meds x 4 days. Pt later changed this complaint to not eating for 4 days.

## 2012-08-22 NOTE — ED Provider Notes (Signed)
History    This chart was scribed for [name], MD, by [name], ED scribe. The patient was seen in room APA14/APA14 and the patient's care was started at 2219.    CSN: [id number]  Arrival date & time 08/22/12  2149   First MD Initiated Contact with Patient 08/22/12 2219      Chief Complaint  Patient presents with  . V70.1  . Alcohol Problem    (Consider location/radiation/quality/duration/timing/severity/associated sxs/prior treatment) Patient is a 57 y.o. male presenting with alcohol problem. The history is provided by the patient. No language interpreter was used.  Alcohol Problem This is a new problem. The current episode started less than 1 hour ago. The problem occurs constantly. The problem has not changed since onset.Associated symptoms include headaches. Pertinent negatives include no chest pain and no abdominal pain. He has tried nothing for the symptoms.   Jesse Stafford is a 57 y.o. male with a h/o of ETOH abuse, depression, SI, and anxiety, brought in by ambulance who presents to the Emergency Department complaining of sudden onset SI that began tonight. [facility] reports that the he called stating that he was currently depressed and no longer wants to live. They report that his plan was to jump out of a plane. He also reports that he consumed 8 beers this evening, but would have continued to drink if he had access to any more ETOH. He reports that he currently received food stamps and has been out of power and is tired of being cold. He states that he has been depressed for the last year and a half, but has not taken his medications for the last 4 days. In ED, he also complains of a headache from a fall earlier today. He was involuntarily committed to [facility] on 10/10/11.  Past Medical History  Diagnosis Date  . Chest pain 2007    Associated with syncope in 2011  . Hypertension     Echo in 2011-mild LVH, hyperdynamic LV function, mild to moderate AI  . Alcohol abuse    . Tobacco abuse     20 pack years; 1/2 pack per day  . Thrombocytopenia     possible alcohol toxicity; 122,000  . Chronic sinusitis     By CT  . Chest pain   . Stroke   . Depression   . Anxiety     Past Surgical History  Procedure Laterality Date  . Colonscopy    . Colonoscopy with esophagogastroduodenoscopy (egd) N/A 07/30/2012    Procedure: COLONOSCOPY WITH ESOPHAGOGASTRODUODENOSCOPY (EGD);  Surgeon: Corbin Ade, MD;  [facility];  Service: Endoscopy;  Laterality: N/A;  9:30    Family History  Problem Relation Age of Onset  . Hypertension    . Alcohol abuse Neg Hx   . Coronary artery disease Mother 54    CABG  . Coronary artery disease Father 49    CABG  . Colon cancer Father 8    History  Substance Use Topics  . Smoking status: Current Every Day Smoker -- 1.00 packs/day for 40 years    Types: Cigarettes  . Smokeless tobacco: Never Used  . Alcohol Use: 15.0 oz/week    30 drink(s) per week     Comment: Multiple hospital admissions with chest pain and inebriation // states drinks 12 pck weekly      Review of Systems  Constitutional: Negative for fatigue.  HENT: Negative for congestion, sinus pressure and ear discharge.   Eyes: Negative for  discharge.  Respiratory: Negative for cough.   Cardiovascular: Negative for chest pain.  Gastrointestinal: Negative for abdominal pain and diarrhea.  Genitourinary: Negative for frequency and hematuria.  Musculoskeletal: Negative for back pain.  Skin: Negative for rash.  Neurological: Positive for headaches. Negative for seizures.  Psychiatric/Behavioral: Positive for suicidal ideas. Negative for hallucinations.  All other systems reviewed and are negative.   Allergies  Review of patient's allergies indicates no known allergies.  Home Medications   Current Outpatient Rx  Name  Route  Sig  Dispense  Refill  . amLODipine (NORVASC) 10 MG tablet   Oral   Take 10 mg by mouth daily.         Marland Kitchen aspirin  325 MG tablet   Oral   Take 325 mg by mouth every other day.         Marland Kitchen atenolol (TENORMIN) 50 MG tablet   Oral   Take 50 mg by mouth daily. Increased from 25 mg per PCP on 07/28/12         . cloNIDine (CATAPRES) 0.1 MG tablet   Oral   Take 0.1 mg by mouth 2 (two) times daily.         Marland Kitchen omeprazole (PRILOSEC) 20 MG capsule   Oral   Take 1 capsule (20 mg total) by mouth daily.   20 capsule   0     BP 115/76  Pulse 73  Temp(Src) 97.4 F (36.3 C) (Oral)  Resp 18  Ht 5\' 7"  (1.702 m)  Wt 175 lb (79.379 kg)  BMI 27.4 kg/m2  SpO2 92%  Physical Exam  Constitutional: He is oriented to person, place, and time. He appears well-developed.  He smells of ETOH.  HENT:  Head: Normocephalic and atraumatic.  Eyes: Conjunctivae and EOM are normal. No scleral icterus.  Neck: Neck supple. No thyromegaly present.  Cardiovascular: Normal rate and regular rhythm.  Exam reveals no gallop and no friction rub.   No murmur heard. Pulmonary/Chest: No stridor. He has no wheezes. He has no rales. He exhibits no tenderness.  Abdominal: He exhibits no distension. There is no tenderness. There is no rebound.  Musculoskeletal: Normal range of motion. He exhibits no edema.  Lymphadenopathy:    He has no cervical adenopathy.  Neurological: He is oriented to person, place, and time. Coordination normal.  Skin: No rash noted. No erythema.  Psychiatric: He exhibits a depressed mood.    ED Course  Procedures (including critical care time)  DIAGNOSTIC STUDIES: Oxygen Saturation is 92% on room air, adequate by my interpretation.    COORDINATION OF CARE:  22:22- Discussed planned course of treatment with the patient, including a cervical spine CT, head CT, CBC, metabolic panel, ethanol, and drug screen panel, who is agreeable at this time.  Labs Reviewed - No data to display No results found.   No diagnosis found.  Pt suicidal and etoh abuse.  Act to evaluate in the am  MDM  The chart was  scribed for me under my direct supervision.  I personally performed the history, physical, and medical decision making and all procedures in the evaluation of this patient.[name], MD 08/22/12 [phone]

## 2012-08-23 MED ORDER — NICOTINE 14 MG/24HR TD PT24
14.0000 mg | MEDICATED_PATCH | Freq: Once | TRANSDERMAL | Status: DC
  Administered 2012-08-23: 14 mg via TRANSDERMAL
  Filled 2012-08-23: qty 1

## 2012-08-23 MED ORDER — IBUPROFEN 400 MG PO TABS
600.0000 mg | ORAL_TABLET | Freq: Three times a day (TID) | ORAL | Status: DC | PRN
  Administered 2012-08-23: 600 mg via ORAL
  Filled 2012-08-23: qty 2

## 2012-08-23 MED ORDER — ONDANSETRON HCL 4 MG PO TABS: 4.0000 mg | ORAL_TABLET | Freq: Three times a day (TID) | ORAL | Status: DC | PRN

## 2012-08-23 MED ORDER — LORAZEPAM 1 MG PO TABS
2.0000 mg | ORAL_TABLET | ORAL | Status: DC | PRN
  Administered 2012-08-23: 2 mg via ORAL
  Filled 2012-08-23: qty 2

## 2012-08-23 NOTE — ED Notes (Signed)
telepsych being performed at present time, sitter remains at bedside,

## 2012-08-23 NOTE — ED Notes (Signed)
Pt lying down resting, with sitter at bedside.

## 2012-08-23 NOTE — ED Provider Notes (Signed)
The patient seen by me. Patient also evaluated by telemedicine psychiatry. Patient is no longer suicidal can be discharged home does have a history of chronic alcohol abuse problems. Resource guide provided to help him followup for that. Patient stable for discharge.  [name], MD 08/23/12 [phone]

## 2012-08-23 NOTE — ED Notes (Signed)
Pt states that he does not a ride home, AC contacted advised to provide ride for pt from cab company, cab called for pt, address is [address], [place]

## 2012-08-23 NOTE — ED Notes (Signed)
telepsych machine moved to room.

## 2012-08-24 ENCOUNTER — Emergency Department (HOSPITAL_COMMUNITY)
Admission: EM | Admit: 2012-08-24 | Discharge: 2012-08-25 | Disposition: A | Discharge status: Other Acute Inpatient Hospital | Payer: Medicaid Other | Attending: Emergency Medicine | Admitting: Emergency Medicine

## 2012-08-24 ENCOUNTER — Encounter (HOSPITAL_COMMUNITY): Payer: Self-pay | Admitting: *Deleted

## 2012-08-24 DIAGNOSIS — I10 Essential (primary) hypertension: Secondary | ICD-10-CM | POA: Insufficient documentation

## 2012-08-24 DIAGNOSIS — F329 Major depressive disorder, single episode, unspecified: Secondary | ICD-10-CM | POA: Insufficient documentation

## 2012-08-24 DIAGNOSIS — F3289 Other specified depressive episodes: Secondary | ICD-10-CM | POA: Insufficient documentation

## 2012-08-24 DIAGNOSIS — R45850 Homicidal ideations: Secondary | ICD-10-CM | POA: Insufficient documentation

## 2012-08-24 DIAGNOSIS — T1490XA Injury, unspecified, initial encounter: Secondary | ICD-10-CM | POA: Insufficient documentation

## 2012-08-24 DIAGNOSIS — Z8709 Personal history of other diseases of the respiratory system: Secondary | ICD-10-CM | POA: Insufficient documentation

## 2012-08-24 DIAGNOSIS — R45851 Suicidal ideations: Secondary | ICD-10-CM | POA: Insufficient documentation

## 2012-08-24 DIAGNOSIS — Z8679 Personal history of other diseases of the circulatory system: Secondary | ICD-10-CM | POA: Insufficient documentation

## 2012-08-24 DIAGNOSIS — F1010 Alcohol abuse, uncomplicated: Secondary | ICD-10-CM | POA: Insufficient documentation

## 2012-08-24 DIAGNOSIS — Z79899 Other long term (current) drug therapy: Secondary | ICD-10-CM | POA: Insufficient documentation

## 2012-08-24 DIAGNOSIS — F17200 Nicotine dependence, unspecified, uncomplicated: Secondary | ICD-10-CM | POA: Insufficient documentation

## 2012-08-24 DIAGNOSIS — F411 Generalized anxiety disorder: Secondary | ICD-10-CM | POA: Insufficient documentation

## 2012-08-24 DIAGNOSIS — Z8673 Personal history of transient ischemic attack (TIA), and cerebral infarction without residual deficits: Secondary | ICD-10-CM | POA: Insufficient documentation

## 2012-08-24 DIAGNOSIS — Z7982 Long term (current) use of aspirin: Secondary | ICD-10-CM | POA: Insufficient documentation

## 2012-08-24 DIAGNOSIS — Z8669 Personal history of other diseases of the nervous system and sense organs: Secondary | ICD-10-CM | POA: Insufficient documentation

## 2012-08-24 DIAGNOSIS — Z862 Personal history of diseases of the blood and blood-forming organs and certain disorders involving the immune mechanism: Secondary | ICD-10-CM | POA: Insufficient documentation

## 2012-08-24 LAB — BASIC METABOLIC PANEL
Calcium: 9 mg/dL (ref 8.4–10.5)
Creatinine, Ser: 0.7 mg/dL (ref 0.50–1.35)
GFR calc Af Amer: >90 mL/min (ref >90)
GFR calc non Af Amer: >90 mL/min (ref >90)

## 2012-08-24 LAB — CBC WITH DIFFERENTIAL/PLATELET
Basophils Absolute: 0.1 10*3/uL (ref 0.0–0.1)
Basophils Relative: 1 % (ref 0–1)
Eosinophils Relative: 3 % (ref 0–5)
HCT: 51.5 % (ref 39.0–52.0)
MCHC: 34.4 g/dL (ref 30.0–36.0)
MCV: 88.9 fL (ref 78.0–100.0)
Monocytes Absolute: 0.3 10*3/uL (ref 0.1–1.0)
RDW: 14 % (ref 11.5–15.5)

## 2012-08-24 LAB — ETHANOL: Alcohol, Ethyl (B): 316 mg/dL — ABNORMAL HIGH (ref 0–11)

## 2012-08-24 NOTE — ED Notes (Signed)
Pt states he wants to kill himself. Pt states he would kill himself with a pistol if he had a one. Pt also says he would like to kill a friend.

## 2012-08-24 NOTE — ED Notes (Signed)
Pt keeps banging shackles around on bed.
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
## 2012-08-24 NOTE — ED Notes (Signed)
Pt was asked if he could urinate and he said "No, I will just pee on myself." Pt now shackled to the bed.

## 2012-08-25 ENCOUNTER — Inpatient Hospital Stay (HOSPITAL_COMMUNITY)
Admission: AD | Admit: 2012-08-25 | Discharge: 2012-08-28 | DRG: 897 | Disposition: A | Discharge status: Home / Self Care | Payer: MEDICAID | Attending: Psychiatry | Admitting: Psychiatry

## 2012-08-25 ENCOUNTER — Encounter (HOSPITAL_COMMUNITY): Payer: Self-pay | Admitting: *Deleted

## 2012-08-25 DIAGNOSIS — F17200 Nicotine dependence, unspecified, uncomplicated: Secondary | ICD-10-CM | POA: Diagnosis present

## 2012-08-25 DIAGNOSIS — F10939 Alcohol use, unspecified with withdrawal, unspecified: Principal | ICD-10-CM | POA: Diagnosis present

## 2012-08-25 DIAGNOSIS — Z720 Tobacco use: Secondary | ICD-10-CM

## 2012-08-25 DIAGNOSIS — F10239 Alcohol dependence with withdrawal, unspecified: Principal | ICD-10-CM

## 2012-08-25 DIAGNOSIS — I10 Essential (primary) hypertension: Secondary | ICD-10-CM | POA: Diagnosis present

## 2012-08-25 DIAGNOSIS — F1010 Alcohol abuse, uncomplicated: Secondary | ICD-10-CM | POA: Diagnosis present

## 2012-08-25 DIAGNOSIS — F1020 Alcohol dependence, uncomplicated: Secondary | ICD-10-CM | POA: Diagnosis present

## 2012-08-25 DIAGNOSIS — Z79899 Other long term (current) drug therapy: Secondary | ICD-10-CM

## 2012-08-25 LAB — ETHANOL: Alcohol, Ethyl (B): 167 mg/dL — ABNORMAL HIGH (ref 0–11)

## 2012-08-25 MED ORDER — CLONIDINE HCL 0.1 MG/24HR TD PTWK
0.1000 mg | MEDICATED_PATCH | TRANSDERMAL | Status: DC
  Administered 2012-08-25: 0.1 mg via TRANSDERMAL
  Filled 2012-08-25 (×2): qty 1

## 2012-08-25 MED ORDER — CITALOPRAM HYDROBROMIDE 10 MG PO TABS
10.0000 mg | ORAL_TABLET | Freq: Every day | ORAL | Status: DC
  Administered 2012-08-26 – 2012-08-28 (×3): 10 mg via ORAL
  Filled 2012-08-25 (×3): qty 1
  Filled 2012-08-25: qty 14
  Filled 2012-08-25: qty 1

## 2012-08-25 MED ORDER — VITAMIN B-1 100 MG PO TABS
100.0000 mg | ORAL_TABLET | Freq: Every day | ORAL | Status: DC
  Administered 2012-08-26 – 2012-08-28 (×3): 100 mg via ORAL
  Filled 2012-08-25 (×5): qty 1

## 2012-08-25 MED ORDER — ACETAMINOPHEN 325 MG PO TABS: 650.0000 mg | ORAL_TABLET | Freq: Four times a day (QID) | ORAL | Status: DC | PRN

## 2012-08-25 MED ORDER — CHLORDIAZEPOXIDE HCL 25 MG PO CAPS
25.0000 mg | ORAL_CAPSULE | Freq: Four times a day (QID) | ORAL | Status: AC
  Administered 2012-08-26 – 2012-08-27 (×6): 25 mg via ORAL
  Filled 2012-08-25 (×5): qty 1

## 2012-08-25 MED ORDER — HYDROXYZINE HCL 25 MG PO TABS: 25.0000 mg | ORAL_TABLET | Freq: Four times a day (QID) | ORAL | Status: DC | PRN

## 2012-08-25 MED ORDER — PANTOPRAZOLE SODIUM 40 MG PO TBEC
40.0000 mg | DELAYED_RELEASE_TABLET | Freq: Every day | ORAL | Status: DC
  Administered 2012-08-26 – 2012-08-28 (×3): 40 mg via ORAL
  Filled 2012-08-25 (×5): qty 1

## 2012-08-25 MED ORDER — CHLORDIAZEPOXIDE HCL 25 MG PO CAPS: 25.0000 mg | ORAL_CAPSULE | Freq: Every day | ORAL | Status: DC

## 2012-08-25 MED ORDER — ONDANSETRON 4 MG PO TBDP: 4.0000 mg | ORAL_TABLET | Freq: Four times a day (QID) | ORAL | Status: DC | PRN

## 2012-08-25 MED ORDER — CHLORDIAZEPOXIDE HCL 25 MG PO CAPS
25.0000 mg | ORAL_CAPSULE | Freq: Three times a day (TID) | ORAL | Status: AC
  Administered 2012-08-27 – 2012-08-28 (×3): 25 mg via ORAL
  Filled 2012-08-25 (×3): qty 1

## 2012-08-25 MED ORDER — ATENOLOL 50 MG PO TABS
50.0000 mg | ORAL_TABLET | Freq: Every day | ORAL | Status: DC
  Filled 2012-08-25: qty 1

## 2012-08-25 MED ORDER — THIAMINE HCL 100 MG/ML IJ SOLN
100.0000 mg | Freq: Once | INTRAMUSCULAR | Status: AC
  Administered 2012-08-26: 100 mg via INTRAMUSCULAR

## 2012-08-25 MED ORDER — CITALOPRAM HYDROBROMIDE 20 MG PO TABS
10.0000 mg | ORAL_TABLET | Freq: Every day | ORAL | Status: DC
  Administered 2012-08-25: 10 mg via ORAL
  Filled 2012-08-25 (×2): qty 1

## 2012-08-25 MED ORDER — ATENOLOL 50 MG PO TABS
50.0000 mg | ORAL_TABLET | Freq: Every day | ORAL | Status: DC
  Administered 2012-08-25: 50 mg via ORAL
  Filled 2012-08-25 (×2): qty 1

## 2012-08-25 MED ORDER — NICOTINE 21 MG/24HR TD PT24
21.0000 mg | MEDICATED_PATCH | Freq: Every day | TRANSDERMAL | Status: DC
  Administered 2012-08-27 – 2012-08-28 (×2): 21 mg via TRANSDERMAL
  Filled 2012-08-25 (×5): qty 1

## 2012-08-25 MED ORDER — ALUM & MAG HYDROXIDE-SIMETH 200-200-20 MG/5ML PO SUSP: 30.0000 mL | ORAL | Status: DC | PRN

## 2012-08-25 MED ORDER — ADULT MULTIVITAMIN W/MINERALS CH
1.0000 | ORAL_TABLET | Freq: Every day | ORAL | Status: DC
  Administered 2012-08-26 – 2012-08-28 (×3): 1 via ORAL
  Filled 2012-08-25 (×5): qty 1

## 2012-08-25 MED ORDER — CHLORDIAZEPOXIDE HCL 25 MG PO CAPS
50.0000 mg | ORAL_CAPSULE | Freq: Once | ORAL | Status: AC
  Administered 2012-08-25: 50 mg via ORAL
  Filled 2012-08-25: qty 2

## 2012-08-25 MED ORDER — MAGNESIUM HYDROXIDE 400 MG/5ML PO SUSP: 30.0000 mL | Freq: Every day | ORAL | Status: DC | PRN

## 2012-08-25 MED ORDER — LOPERAMIDE HCL 2 MG PO CAPS: 2.0000 mg | ORAL_CAPSULE | ORAL | Status: DC | PRN

## 2012-08-25 MED ORDER — ASPIRIN EC 325 MG PO TBEC
325.0000 mg | DELAYED_RELEASE_TABLET | Freq: Every day | ORAL | Status: DC
  Administered 2012-08-26 – 2012-08-28 (×3): 325 mg via ORAL
  Filled 2012-08-25 (×5): qty 1

## 2012-08-25 MED ORDER — AMLODIPINE BESYLATE 10 MG PO TABS
10.0000 mg | ORAL_TABLET | Freq: Every day | ORAL | Status: DC
  Filled 2012-08-25: qty 1

## 2012-08-25 MED ORDER — CHLORDIAZEPOXIDE HCL 25 MG PO CAPS: 25.0000 mg | ORAL_CAPSULE | ORAL | Status: DC

## 2012-08-25 MED ORDER — AMLODIPINE BESYLATE 10 MG PO TABS
10.0000 mg | ORAL_TABLET | Freq: Every day | ORAL | Status: DC
  Administered 2012-08-25: 10 mg via ORAL
  Filled 2012-08-25: qty 1
  Filled 2012-08-25: qty 2

## 2012-08-25 MED ORDER — ACETAMINOPHEN 325 MG PO TABS
ORAL_TABLET | ORAL | Status: AC
  Filled 2012-08-25: qty 2

## 2012-08-25 MED ORDER — CHLORDIAZEPOXIDE HCL 25 MG PO CAPS: 25.0000 mg | ORAL_CAPSULE | Freq: Four times a day (QID) | ORAL | Status: DC | PRN

## 2012-08-25 MED ORDER — CLONIDINE HCL 0.1 MG PO TABS
0.1000 mg | ORAL_TABLET | Freq: Two times a day (BID) | ORAL | Status: DC
  Filled 2012-08-25: qty 1
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
## 2012-08-25 NOTE — ED Notes (Signed)
Report called to [place], RN at [facility].

## 2012-08-25 NOTE — ED Provider Notes (Signed)
Patient seen for depression with suicidal ideation. He was also threatening to harm others as well. Patient was acutely intoxicated at his arrival. Tele-psychiatry evaluation has not been completed and it is felt that the patient is still a danger to himself and others. Commitment to inpatient psychiatric facility has been recommended. Start Celexa as per plan. ACT Team to see patient for continuing attempts at placement.  Gilda Crease, MD 08/25/12 [phone]

## 2012-08-25 NOTE — BHH Counselor (Signed)
The patient has been accepted to [facility] as an involuntary admission. Tessa Lerner NP accepted to the service of dr I. Dub Mikes. Support paper work completed and distributed. Unable to complete Medicaid presert due to time constraints. Dr Blinda Leatherwood advised of pending transfer.
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
## 2012-08-25 NOTE — ED Notes (Signed)
Pt states there is one person he dont like but he wouldn't kill him. Pt rolling his eyes a lot. Short answers. Does make eye contact some. Denies needs at this time. Advised awaiting ACT for consult. Pt states it doesnt matter if he goes anywhere or not it wont help. Sitter at bedside. Cooperative at this time.

## 2012-08-25 NOTE — Progress Notes (Signed)
S Notified by Rn pt with elevated BP with DBP @ 110. Pt experiencing some HA, but denies N/V, vertigo, near syncope, blurry vision or ataxia sx at this present.  O V/S: DBP > 110  Pulm: CTA  Cardio: audible S1, S2  HEENT: PERRLA, fundoscopic eval completed no signs of r/o papilledema, nicking or hemorrhage on exam bilaterally.  A/P:  Hypertensive emergency, will proceed with administering home BP medications tonight, instead of tomorrow AM.

## 2012-08-25 NOTE — Tx Team (Signed)
Initial Interdisciplinary Treatment Plan  PATIENT STRENGTHS: (choose at least two) Active sense of humor Average or above average intelligence Communication skills General fund of knowledge Supportive family/friends Work skills  PATIENT STRESSORS: Financial difficulties Health problems Medication change or noncompliance Substance abuse   PROBLEM LIST: Problem List/Patient Goals Date to be addressed Date deferred Reason deferred Estimated date of resolution  ETOH abuse 08-25-12     Hx. CVA 08-25-12     Hx. Of reporting [facility] 08-25-12                                          DISCHARGE CRITERIA:  Improved stabilization in mood, thinking, and/or behavior Medical problems require only outpatient monitoring Motivation to continue treatment in a less acute level of care Reduction of life-threatening or endangering symptoms to within safe limits Verbal commitment to aftercare and medication compliance  PRELIMINARY DISCHARGE PLAN: Attend 12-step recovery group Outpatient therapy Participate in family therapy Return to previous living arrangement Return to previous work or school arrangements  PATIENT/FAMIILY INVOLVEMENT: This treatment plan has been presented to and reviewed with the patient, Jesse Stafford, and/or family member.  The patient and family have been given the opportunity to ask questions and make suggestions.  Mickeal Needy 08/25/2012, 10:32 PM
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
## 2012-08-26 DIAGNOSIS — F10239 Alcohol dependence with withdrawal, unspecified: Principal | ICD-10-CM | POA: Diagnosis present

## 2012-08-26 DIAGNOSIS — F1020 Alcohol dependence, uncomplicated: Secondary | ICD-10-CM | POA: Diagnosis present

## 2012-08-26 DIAGNOSIS — F1010 Alcohol abuse, uncomplicated: Secondary | ICD-10-CM

## 2012-08-26 MED ORDER — ATENOLOL 50 MG PO TABS
50.0000 mg | ORAL_TABLET | Freq: Every day | ORAL | Status: DC
  Administered 2012-08-26 – 2012-08-27 (×2): 50 mg via ORAL
  Filled 2012-08-26 (×4): qty 1

## 2012-08-26 MED ORDER — TRAZODONE HCL 50 MG PO TABS
50.0000 mg | ORAL_TABLET | Freq: Every evening | ORAL | Status: DC | PRN
  Administered 2012-08-26 – 2012-08-27 (×2): 50 mg via ORAL
  Filled 2012-08-26 (×2): qty 1
  Filled 2012-08-26: qty 28
  Filled 2012-08-26: qty 1
  Filled 2012-08-26: qty 28
  Filled 2012-08-26 (×3): qty 1

## 2012-08-26 MED ORDER — AMLODIPINE BESYLATE 10 MG PO TABS: 10.0000 mg | ORAL_TABLET | Freq: Once | ORAL | Status: DC

## 2012-08-26 MED ORDER — AMLODIPINE BESYLATE 10 MG PO TABS
10.0000 mg | ORAL_TABLET | Freq: Every day | ORAL | Status: DC
  Administered 2012-08-26 – 2012-08-28 (×3): 10 mg via ORAL
  Filled 2012-08-26 (×4): qty 1
  Filled 2012-08-26: qty 4

## 2012-08-26 MED ORDER — ATENOLOL 50 MG PO TABS: 50.0000 mg | ORAL_TABLET | Freq: Once | ORAL | Status: DC

## 2012-08-26 NOTE — Progress Notes (Signed)
Nutrition Brief Note  Patient identified on the Malnutrition Screening Tool (MST) Report  Body mass index is 26.71 kg/(m^2). Patient meets criteria for overweight based on current BMI.   Current diet order is regular.  Patient reports that he has not had much appetite.  He did eat lunch but has not been eating well at other meals.  UBW 172 lbs prior to colonoscopy 3 weeks ago.  Patient with 4 lbs weight loss in the past 3 weeks.  Intake improved prior to admit.  Declines Ensure at this time.   Labs and medications reviewed.   Encouraged intake and discussed ETOH effects on nutrition.  No nutrition interventions warranted at this time. If nutrition issues arise, please consult RD.   Oran Rein, RD, LDN Clinical Inpatient Dietitian Pager:  [phone] Weekend and after hours pager:  [phone]

## 2012-08-26 NOTE — Progress Notes (Signed)
Recreation Therapy Notes  Date: 03.25.2014 Time: 3:00pm Location: 300 Hall Day Room       Group Topic/Focus: Goal Setting  Participation Level: Active  Participation Quality: Appropriate  Affect: Euthymic  Cognitive: Appropriate  Additional Comments: Patient with peer played "Joined at the Hip." Patient and peer set goal of walking to 2nd line on 300 hallway. Patient and peer reached this goal. Patient and peer set second goal of walking to the tree on the wall. Patient with peer reached this goal. Patient with peers played "Paperclip chain." Patient with peers built paperclip chain. Patient with peers were successful at building chain with 4 paperclips. Patient with peers set goal of building a chain with 12 paperclips. Patient with peers did not reach goal. Patient participation in wrap up discussion about importance of setting goals and reaching goals.   Marykay Lex Anita Mcadory, LRT/CTRS  Jearl Klinefelter 08/26/2012 5:15 PM
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
## 2012-08-26 NOTE — Progress Notes (Signed)
Patient ID: Jesse Stafford, male   DOB: 16-Mar-1956, 57 y.o.   MRN: [id number] He has been up and to part of the groups interacting a little with peer and staff. Has been in bed this afternoon and just got up for  Group. He denies having withdrawal symptome and denies any discomfort.

## 2012-08-26 NOTE — Progress Notes (Signed)
[facility] LCSW Group Therapy  08/26/2012 1:15 PM  Type of Therapy:  Group Therapy 1:15 to 2:30   Participation Level:  Did Not Attend   Clide Dales 08/26/2012, 5:00 PM

## 2012-08-26 NOTE — [facility] Suicide Risk Assessment (Signed)
Suicide Risk Assessment  Admission Assessment     Nursing information obtained from:  Patient Demographic factors:  Male;Caucasian;Low socioeconomic status Current Mental Status:  NA Loss Factors:  Decline in physical health Historical Factors:  NA Risk Reduction Factors:  Living with another person, especially a relative  CLINICAL FACTORS:   Depression:   Aggression Comorbid alcohol abuse/dependence Hopelessness Impulsivity Insomnia Alcohol/Substance Abuse/Dependencies Previous Psychiatric Diagnoses and Treatments  COGNITIVE FEATURES THAT CONTRIBUTE TO RISK:  Closed-mindedness    SUICIDE RISK:   Moderate:  Frequent suicidal ideation with limited intensity, and duration, some specificity in terms of plans, no associated intent, good self-control, limited dysphoria/symptomatology, some risk factors present, and identifiable protective factors, including available and accessible social support.  PLAN OF CARE:1. Admit for crisis management and stabilization. 2. Medication management to reduce current symptoms to base line and improve the patient's overall level of functioning 3. Treat health problems as indicated. 4. Develop treatment plan to decrease risk of relapse upon discharge and the need for  readmission. 5. Psycho-social education regarding relapse prevention and self care. 6. Health care follow up as needed for medical problems. 7. Restart home medications where appropriate.   I certify that inpatient services furnished can reasonably be expected to improve the patient's condition.  Thedore Mins, MD 08/26/2012, 11:03 AM

## 2012-08-26 NOTE — Progress Notes (Signed)
Pt attended AA group; affect remains flat and depressed; pt was attentive and appropriate.
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
## 2012-08-27 DIAGNOSIS — F1020 Alcohol dependence, uncomplicated: Secondary | ICD-10-CM

## 2012-08-27 MED ORDER — ATENOLOL 100 MG PO TABS
100.0000 mg | ORAL_TABLET | Freq: Every day | ORAL | Status: DC
  Administered 2012-08-28: 100 mg via ORAL
  Filled 2012-08-27 (×3): qty 1

## 2012-08-27 MED ORDER — ATENOLOL 50 MG PO TABS
50.0000 mg | ORAL_TABLET | Freq: Once | ORAL | Status: AC
  Administered 2012-08-27: 50 mg via ORAL
  Filled 2012-08-27 (×2): qty 1

## 2012-08-27 NOTE — Progress Notes (Signed)
Patient ID: Jesse Stafford, male   DOB: 26-Aug-1955, 57 y.o.   MRN: [id number] He has been up and to part of the groups interacting with peers and staff. He denies having withdrawal symptoms. Has been observed on the phone often today.

## 2012-08-27 NOTE — Progress Notes (Signed)
Adult Psychoeducational Group Note  Date:  08/27/2012 Time:  12:10 PM  Group Topic/Focus:  Coping With Mental Health Crisis:   The purpose of this group is to help patients identify strategies for coping with mental health crisis.  Group discusses possible causes of crisis and ways to manage them effectively.  Participation Level:  Active  Participation Quality:  Appropriate, Attentive and Sharing  Affect:  Appropriate  Cognitive:  Alert and Appropriate  Insight: Appropriate  Engagement in Group:  Engaged  Modes of Intervention:  Discussion  Additional Comments:  Pt was appropriate and attentive while attending group. Pt shared that taking medication is something that helps with his crisis and finding a hobby in the future to help before going into a crisis.   Sharyn Lull 08/27/2012, 12:10 PM

## 2012-08-27 NOTE — Progress Notes (Signed)
[facility] LCSW Aftercare Discharge Planning Group Note  08/27/2012 8:45 AM  Participation Quality:  Appropriate  Affect:  Appropriate  Cognitive:  Alert and Oriented  Insight:  Improving  Engagement in Group:  Improving  Modes of Intervention:  Clarification, Exploration, Rapport Building and Support  Summary of Progress/Problems:  Pt denies both suicidal and homicidal ideation.  On a scale of 1 to 10 with ten being the most ever experienced, the patient rates depression at a 5 and anxiety at a 2.  Patient reports that he now realizes that not taking his blood pressure medication and antidepressant were factors that led to relapse.  Patient reiterated his desire to change providers from [facility] to [facility]; CSW to explore   [place], Julious Payer

## 2012-08-27 NOTE — H&P (Signed)
Seen and agreed. Marsheila Alejo, MD 

## 2012-08-27 NOTE — Tx Team (Signed)
Interdisciplinary Treatment Plan Update (Adult)  Date: 08/27/2012  Time Reviewed: 10:44 AM   Progress in Treatment: Attending groups: Yes Participating in groups: Yes Taking medication as prescribed:  Yes Tolerating medication:  Yes Family/Significant othe contact made: No Patient understands diagnosis: Yes Discussing patient identified problems/goals with staff: Yes Medical problems stabilized or resolved: Blood pressure still running high  Denies suicidal/homicidal ideation: Yes Patient has not harmed self or Others: Yes  New problem(s) identified: None Identified  Discharge Plan or Barriers:  CSW to refer patient for medication management and therapy in [place].  Patient was offered option of inpatient treatment as he was here in May 2013 for same, yet refuses to go due to his need to get home and care for dog.   Additional comments: N/A  Reason for Continuation of Hospitalization: Medical Issues elevated blood pressure Medication stabilization   Estimated length of stay: 1-2 days  For review of initial/current patient goals, please see plan of care.  Attendees: Patient:     Family:     Physician:  Serena Colonel PA 08/27/2012 10:44 AM   Nursing:   Roswell Miners, RN 08/27/2012 10:44 AM   Clinical Social Worker Ronda Fairly 08/27/2012 10:44 AM   Other:  Liliane Bade, Community Care Cooordinator 08/27/2012 10:44 AM   Other:  Robbie Louis, RN 08/27/2012 10:44 AM   Other:      Other:      Scribe for Treatment Team:   Carney Bern, LCSWA  08/27/2012 10:44 AM

## 2012-08-27 NOTE — Progress Notes (Signed)
Patient ID: Jesse Stafford, male   DOB: 1956/02/28, 57 y.o.   MRN: [id number] He has been up and about today and to part of the groups. Says it is hard for him to be around a group of people. Also that he can't see well because he did not have his glasses.  Says that he is going go home and follow up  With out patient. He wants  To leave by Saturday because there will be no one to care for his dog.

## 2012-08-27 NOTE — Progress Notes (Signed)
[facility] MD Progress Note  08/27/2012 4:40 PM Jesse Stafford  MRN:  [id number]  Subjective:  "I'm feeling a little better. I have no withdrawal symptoms. I've learned a lot from the groups, things that I had not known in the past. I feel good about coming here".  Diagnosis:   Axis I: Alcohol dependence, alcohol Axis II: Deferred Axis III:  Past Medical History  Diagnosis Date  . Chest pain 2007    Associated with syncope in 2011  . Hypertension     Echo in 2011-mild LVH, hyperdynamic LV function, mild to moderate AI  . Alcohol abuse   . Tobacco abuse     20 pack years; 1/2 pack per day  . Thrombocytopenia     possible alcohol toxicity; 122,000  . Chronic sinusitis     By CT  . Chest pain   . Stroke   . Depression   . Anxiety    Axis IV: other psychosocial or environmental problems Axis V: 41-50 serious symptoms  ADL's:  Intact  Sleep: Good  Appetite:  Good  Suicidal Ideation:  Plan:  No Intent:  No Means:  No Homicidal Ideation:  Plan:  No Intent:  No Means:  No  AEB (as evidenced by):  Psychiatric Specialty Exam: Review of Systems  Constitutional: Negative.   HENT: Negative.   Eyes: Negative.   Respiratory: Negative.   Cardiovascular: Negative.   Gastrointestinal: Negative.   Genitourinary: Negative.   Musculoskeletal: Negative.   Skin: Negative.        Some abrasion to left palm  Neurological: Negative.   Endo/Heme/Allergies: Negative.   Psychiatric/Behavioral: Positive for substance abuse. Negative for depression, suicidal ideas, hallucinations and memory loss. The patient is not nervous/anxious and does not have insomnia.     Blood pressure 144/82, pulse 54, temperature 97.8 F (36.6 C), temperature source Oral, resp. rate 16, height 5' 6.5" (1.689 m), weight 76.204 kg (168 lb), SpO2 94.00%.Body mass index is 26.71 kg/(m^2).  General Appearance: Casual  Eye Contact::  Good  Speech:  Clear and Coherent  Volume:  Normal  Mood:  "Feeling a  little better"  Affect:  Congruent  Thought Process:  Coherent, Goal Directed and Intact  Orientation:  Full (Time, Place, and Person)  Thought Content:  Rumination  Suicidal Thoughts:  No  Homicidal Thoughts:  No  Memory:  Immediate;   Good Recent;   Good Remote;   Good  Judgement:  Fair  Insight:  Fair  Psychomotor Activity:  Normal  Concentration:  Good  Recall:  Good  Akathisia:  No  Handed:  Right  AIMS (if indicated):     Assets:  Desire for Improvement  Sleep:  Number of Hours: 6   Current Medications: Current Facility-Administered Medications  Medication Dose Route Frequency Provider Last Rate Last Dose  . acetaminophen (TYLENOL) tablet 650 mg  650 mg Oral Q6H PRN Verne Spurr, PA-C      . alum & mag hydroxide-simeth (MAALOX/MYLANTA) 200-200-20 MG/5ML suspension 30 mL  30 mL Oral Q4H PRN Verne Spurr, PA-C      . amLODipine (NORVASC) tablet 10 mg  10 mg Oral Daily Rachael Fee, MD   10 mg at 08/27/12 4098  . aspirin EC tablet 325 mg  325 mg Oral Daily Verne Spurr, PA-C   325 mg at 08/27/12 0809  . atenolol (TENORMIN) tablet 50 mg  50 mg Oral Daily Rachael Fee, MD   50 mg at 08/27/12 1191  . chlordiazePOXIDE (LIBRIUM)  capsule 25 mg  25 mg Oral Q6H PRN Verne Spurr, PA-C      . chlordiazePOXIDE (LIBRIUM) capsule 25 mg  25 mg Oral TID Verne Spurr, PA-C       Followed by  . [START ON 08/28/2012] chlordiazePOXIDE (LIBRIUM) capsule 25 mg  25 mg Oral BH-qamhs Verne Spurr, PA-C       Followed by  . [START ON 08/30/2012] chlordiazePOXIDE (LIBRIUM) capsule 25 mg  25 mg Oral Daily Verne Spurr, PA-C      . citalopram (CELEXA) tablet 10 mg  10 mg Oral Daily Verne Spurr, PA-C   10 mg at 08/27/12 0810  . cloNIDine (CATAPRES - Dosed in mg/24 hr) patch 0.1 mg  0.1 mg Transdermal Weekly Kerry Hough, PA-C   0.1 mg at 08/25/12 2353  . hydrOXYzine (ATARAX/VISTARIL) tablet 25 mg  25 mg Oral Q6H PRN Verne Spurr, PA-C      . loperamide (IMODIUM) capsule 2-4 mg  2-4 mg Oral  PRN Verne Spurr, PA-C      . magnesium hydroxide (MILK OF MAGNESIA) suspension 30 mL  30 mL Oral Daily PRN Verne Spurr, PA-C      . multivitamin with minerals tablet 1 tablet  1 tablet Oral Daily Verne Spurr, PA-C   1 tablet at 08/27/12 1610  . nicotine (NICODERM CQ - dosed in mg/24 hours) patch 21 mg  21 mg Transdermal Q0600 Verne Spurr, PA-C   21 mg at 08/27/12 9604  . ondansetron (ZOFRAN-ODT) disintegrating tablet 4 mg  4 mg Oral Q6H PRN Verne Spurr, PA-C      . pantoprazole (PROTONIX) EC tablet 40 mg  40 mg Oral Daily Verne Spurr, PA-C   40 mg at 08/27/12 5409  . thiamine (VITAMIN B-1) tablet 100 mg  100 mg Oral Daily Verne Spurr, PA-C   100 mg at 08/27/12 8119  . traZODone (DESYREL) tablet 50 mg  50 mg Oral QHS,MR X 1 Kerry Hough, PA-C   50 mg at 08/26/12 2305    Lab Results: No results found for this or any previous visit (from the past 48 hour(s)).  Physical Findings: AIMS: Facial and Oral Movements Muscles of Facial Expression: None, normal Lips and Perioral Area: None, normal Jaw: None, normal Tongue: None, normal,Extremity Movements Upper (arms, wrists, hands, fingers): None, normal Lower (legs, knees, ankles, toes): None, normal, Trunk Movements Neck, shoulders, hips: None, normal, Overall Severity Severity of abnormal movements (highest score from questions above): None, normal Incapacitation due to abnormal movements: None, normal Patient's awareness of abnormal movements (rate only patient's report): No Awareness, Dental Status Current problems with teeth and/or dentures?: Yes (chipped front teeth, back cavities) Does patient usually wear dentures?: No  CIWA:  CIWA-Ar Total: 0 COWS:     Treatment Plan Summary: Daily contact with patient to assess and evaluate symptoms and progress in treatment Medication management  Plan: Supportive approach/coping skills/relapse prevention. Increased Atenolol from 50 mg to 100 mg daily for increased HTN  levels. Encouraged out of room, participation in group sessions and application of coping skills when distressed. Will continue to monitor response to/adverse effects of medications in use to assure effectiveness. Continue to monitor mood, behavior and interaction with staff and other patients. Continue current plan of care.  Medical Decision Making Problem Points:  Established problem, stable/improving (1), Review of last therapy session (1) and Review of psycho-social stressors (1) Data Points:  Review of medication regiment & side effects (2) Review of new medications or change in dosage (2)  I certify that inpatient services furnished can reasonably be expected to improve the patient's condition.   Armandina Stammer I 08/27/2012, 4:40 PM

## 2012-08-27 NOTE — Progress Notes (Signed)
[facility] INPATIENT:  Family/Significant Other Suicide Prevention Education  Suicide Prevention Education:  Patient Refusal for Family/Significant Other Suicide Prevention Education: The patient Jesse Stafford has refused to provide written consent for family/significant other to be provided Family/Significant Other Suicide Prevention Education during admission and/or prior to discharge.  Physician notified. Writer provided suicide prevention education directly to patient; conversation included risk factors, warning signs and resources to contact for help. Mobile crisis services explained and contact card placed in chart for pt to receive at discharge.   Clide Dales 08/27/2012, 7:10 PM

## 2012-08-28 DIAGNOSIS — F10239 Alcohol dependence with withdrawal, unspecified: Principal | ICD-10-CM

## 2012-08-28 LAB — URINALYSIS, ROUTINE W REFLEX MICROSCOPIC

## 2012-08-28 MED ORDER — ATENOLOL 50 MG PO TABS: 50.0000 mg | ORAL_TABLET | Freq: Every day | ORAL | Status: AC

## 2012-08-28 MED ORDER — CITALOPRAM HYDROBROMIDE 10 MG PO TABS: 10.0000 mg | ORAL_TABLET | Freq: Every day | ORAL | Status: DC

## 2012-08-28 MED ORDER — OMEPRAZOLE 20 MG PO CPDR: 20.0000 mg | DELAYED_RELEASE_CAPSULE | Freq: Every day | ORAL | Status: DC | PRN

## 2012-08-28 MED ORDER — TRAZODONE HCL 50 MG PO TABS: 50.0000 mg | ORAL_TABLET | Freq: Every evening | ORAL | Status: DC | PRN

## 2012-08-28 MED ORDER — CLONIDINE HCL 0.1 MG PO TABS: 0.1000 mg | ORAL_TABLET | Freq: Two times a day (BID) | ORAL | Status: AC

## 2012-08-28 MED ORDER — AMLODIPINE BESYLATE 10 MG PO TABS: 10.0000 mg | ORAL_TABLET | Freq: Every day | ORAL | Status: AC

## 2012-08-28 MED ORDER — ASPIRIN 325 MG PO TABS: 325.0000 mg | ORAL_TABLET | Freq: Every day | ORAL | Status: AC

## 2012-08-28 NOTE — Progress Notes (Signed)
[facility] LCSW Group Therapy - Late Entry  08/27/2012 1:15 PM  Type of Therapy: Group Therapy 1:15 to 2:30 PM  Participation Level: Appropriate  Participation Quality: Attentive, Sharing  Affect: Appropriate  Cognitive: Alert, Oriented   Insight: Developing  Engagement in Therapy:  Engaged  Modes of Intervention: Discussion, Exploration, Support, Problemsolving  Summary of Progress/Problems: The focus of this group session was to process how we deal with difficult emotions and share with others the patterns that play out when we are reacting to the emotion verses the situation.  Jesse Stafford shared that one of the most difficult emotions he deals with is pain from keeping everything bottled up.  "It just makes the pain worse."   Harrill, Julious Payer

## 2012-08-28 NOTE — [facility] Suicide Risk Assessment (Signed)
Suicide Risk Assessment  Discharge Assessment     Demographic Factors:  Male, Caucasian, Low socioeconomic status and Unemployed  Mental Status Per Nursing Assessment::   On Admission:  NA  Current Mental Status by Physician: patient denies suicidal ideation,intent or plan  Loss Factors: Decrease in vocational status, Decline in physical health and Financial problems/change in socioeconomic status  Historical Factors: NA  Risk Reduction Factors:   Sense of responsibility to family and Positive social support  Continued Clinical Symptoms:  Alcohol/Substance Abuse/Dependencies  Cognitive Features That Contribute To Risk:  Closed-mindedness    Suicide Risk:  Minimal: No identifiable suicidal ideation.  Patients presenting with no risk factors but with morbid ruminations; may be classified as minimal risk based on the severity of the depressive symptoms  Discharge Diagnoses:   AXIS I:  Alcohol dependence. Depressive disorder, NOS  AXIS II:  Deferred AXIS III:   Past Medical History  Diagnosis Date  . Chest pain 2007    Associated with syncope in 2011  . Hypertension     Echo in 2011-mild LVH, hyperdynamic LV function, mild to moderate AI  . Tobacco abuse     20 pack years; 1/2 pack per day  . Thrombocytopenia     possible alcohol toxicity; 122,000  . Chronic sinusitis     By CT  . Chest pain   . Stroke    AXIS IV:  other psychosocial or environmental problems and problems related to social environment AXIS V:  61-70 mild symptoms  Plan Of Care/Follow-up recommendations:  Activity:  as tolerated Diet:  healthy Tests:  routine Other:  patient to keep his after care appointment  Is patient on multiple antipsychotic therapies at discharge:  No   Has Patient had three or more failed trials of antipsychotic monotherapy by history:  No  Recommended Plan for Multiple Antipsychotic Therapies: N/A  Sion Reinders,MD 08/28/2012, 11:26 AM
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
## 2012-08-28 NOTE — Progress Notes (Signed)
Patient ID: Jesse Stafford, male   DOB: 04-Aug-1955, 57 y.o.   MRN: [id number] He has bee discharged home and was [place] up [place] the county police. He voiced understanding of discharge instruction and of the follow up plan. He denies thoughts of SI and all belonging taken home with him.

## 2012-08-28 NOTE — Progress Notes (Signed)
[facility] LCSW Aftercare Discharge Planning Group Note  08/28/2012 8:45 AM  Participation Quality:  Appropriate  Affect:  Appropriate  Cognitive:  Alert and Oriented  Insight:  Improving  Engagement in Group:  Improving  Modes of Intervention:  Clarification, Exploration, Socialization and Support  Summary of Progress/Problems:  Pt denies both suicidal and homicidal ideation.  On a scale of 1 to 10 with ten being the most ever experienced, the patient rates depression at a 3 or 4 and anxiety at a 1. Patient agreed with CSW suggestion to have PCP prescribe his medication as patient has difficulty getting to appointments and is past due for medical appointment.  CSW will set up appointment with Dr Ledell Peoples. Patient understands Kathryne Sharper will provide transport home today   Dyane Dustman, Julious Payer

## 2012-08-28 NOTE — Progress Notes (Signed)
[facility] Adult Case Management Discharge Plan :  Will you be returning to the same living situation after discharge: Yes,  home he shares with brother At discharge, do you have transportation home?:Yes,  sherrif Do you have the ability to pay for your medications:Yes,  patient realizes they will be less than alcohol especially through public health clinic  Release of information consent forms completed and in the chart;  Patient's signature needed at discharge.  Patient to Follow up at: Follow-up Information   Follow up with [facility] health Department On 0/02/8118. (Appointment with Dr Kizzie Furnish at 9:50 AM on Monday September 08, 2012)    Contact information:   [address] [place] [address] [facility] [phone] Valinda Hoar [phone]      Patient denies SI/HI:   Yes,  denies both    Safety Planning and Suicide Prevention discussed:  Yes,  with patient  Clide Dales 08/28/2012, 8:19 PM

## 2012-08-28 NOTE — Progress Notes (Signed)
[facility] INPATIENT:  Family/Significant Other Suicide Prevention Education  Suicide Prevention Education:  Patient Refusal for Family/Significant Other Suicide Prevention Education: The patient Jesse Stafford has refused to provide written consent for family/significant other to be provided Family/Significant Other Suicide Prevention Education during admission and/or prior to discharge.  Writer provided suicide prevention education directly to patient today when obtaining patient's signature for ROI; conversation included risk factors, warning signs and resources to contact for help. Mobile crisis services explained and contact card placed in chart for pt to receive at discharge. Patient reports he has no SI unless he is under the influence, importance of abstinence emphasized.    Clide Dales

## 2012-09-01 NOTE — Discharge Summary (Signed)
Seen and agreed. Brendyn Mclaren, MD 

## 2012-09-02 NOTE — Progress Notes (Signed)
Patient Discharge Instructions:  After Visit Summary (AVS):   Faxed to:  09/02/12 Discharge Summary Note:   Faxed to:  09/02/12 Psychiatric Admission Assessment Note:   Faxed to:  09/02/12 Suicide Risk Assessment - Discharge Assessment:   Faxed to:  09/02/12 Faxed/Sent to the Next Level Care provider:  09/02/12 Faxed to [facility] @ [phone]  Jerelene Redden, 09/02/2012, 3:31 PM

## 2012-09-03 NOTE — Progress Notes (Signed)
Agree with assessment and plan Ndidi Nesby A. Riya Huxford, M.D. 

## 2012-09-10 ENCOUNTER — Encounter: Payer: Self-pay | Admitting: Internal Medicine

## 2012-09-15 ENCOUNTER — Emergency Department (HOSPITAL_COMMUNITY): Payer: Medicaid Other

## 2012-09-15 ENCOUNTER — Emergency Department (HOSPITAL_COMMUNITY)
Admission: EM | Admit: 2012-09-15 | Discharge: 2012-09-15 | Disposition: A | Discharge status: Home / Self Care | Payer: Medicaid Other | Attending: Emergency Medicine | Admitting: Emergency Medicine

## 2012-09-15 ENCOUNTER — Ambulatory Visit: Payer: Medicaid Other | Admitting: Gastroenterology

## 2012-09-15 ENCOUNTER — Telehealth: Payer: Self-pay | Admitting: Gastroenterology

## 2012-09-15 ENCOUNTER — Encounter (HOSPITAL_COMMUNITY): Payer: Self-pay | Admitting: *Deleted

## 2012-09-15 DIAGNOSIS — Z862 Personal history of diseases of the blood and blood-forming organs and certain disorders involving the immune mechanism: Secondary | ICD-10-CM | POA: Insufficient documentation

## 2012-09-15 DIAGNOSIS — F329 Major depressive disorder, single episode, unspecified: Secondary | ICD-10-CM | POA: Insufficient documentation

## 2012-09-15 DIAGNOSIS — F411 Generalized anxiety disorder: Secondary | ICD-10-CM | POA: Insufficient documentation

## 2012-09-15 DIAGNOSIS — G589 Mononeuropathy, unspecified: Secondary | ICD-10-CM | POA: Insufficient documentation

## 2012-09-15 DIAGNOSIS — F17200 Nicotine dependence, unspecified, uncomplicated: Secondary | ICD-10-CM | POA: Insufficient documentation

## 2012-09-15 DIAGNOSIS — Z8709 Personal history of other diseases of the respiratory system: Secondary | ICD-10-CM | POA: Insufficient documentation

## 2012-09-15 DIAGNOSIS — F3289 Other specified depressive episodes: Secondary | ICD-10-CM | POA: Insufficient documentation

## 2012-09-15 DIAGNOSIS — Z8673 Personal history of transient ischemic attack (TIA), and cerebral infarction without residual deficits: Secondary | ICD-10-CM | POA: Insufficient documentation

## 2012-09-15 DIAGNOSIS — Z79899 Other long term (current) drug therapy: Secondary | ICD-10-CM | POA: Insufficient documentation

## 2012-09-15 DIAGNOSIS — G629 Polyneuropathy, unspecified: Secondary | ICD-10-CM

## 2012-09-15 DIAGNOSIS — R209 Unspecified disturbances of skin sensation: Secondary | ICD-10-CM | POA: Insufficient documentation

## 2012-09-15 DIAGNOSIS — Z7982 Long term (current) use of aspirin: Secondary | ICD-10-CM | POA: Insufficient documentation

## 2012-09-15 LAB — CBC WITH DIFFERENTIAL/PLATELET
Eosinophils Absolute: 0.1 10*3/uL (ref 0.0–0.7)
Hemoglobin: 16.5 g/dL (ref 13.0–17.0)
Lymphocytes Relative: 48 % — ABNORMAL HIGH (ref 12–46)
Lymphs Abs: 2.6 10*3/uL (ref 0.7–4.0)
MCH: 31 pg (ref 26.0–34.0)
Monocytes Relative: 6 % (ref 3–12)
Neutro Abs: 2.3 10*3/uL (ref 1.7–7.7)
Neutrophils Relative %: 42 % — ABNORMAL LOW (ref 43–77)
Platelets: 201 10*3/uL (ref 150–400)
RBC: 5.32 MIL/uL (ref 4.22–5.81)
WBC: 5.4 10*3/uL (ref 4.0–10.5)

## 2012-09-15 LAB — BASIC METABOLIC PANEL
BUN: 7 mg/dL (ref 6–23)
CO2: 25 mEq/L (ref 19–32)
Chloride: 105 mEq/L (ref 96–112)
GFR calc non Af Amer: >90 mL/min (ref >90)
Glucose, Bld: 118 mg/dL — ABNORMAL HIGH (ref 70–99)
Potassium: 3.4 mEq/L — ABNORMAL LOW (ref 3.5–5.1)
Sodium: 141 mEq/L (ref 135–145)

## 2012-09-15 NOTE — ED Notes (Signed)
Instructions reviewed and f/u information provided - verbalizes understanding; a&ox4; answers questions appropriately; ambulatory with steady gait.

## 2012-09-15 NOTE — Telephone Encounter (Signed)
noted 

## 2012-09-15 NOTE — Telephone Encounter (Signed)
Pt was a no show

## 2012-09-15 NOTE — ED Provider Notes (Signed)
History     CSN: [id number]  Arrival date & time 09/15/12  1642   First MD Initiated Contact with Patient 09/15/12 1652      Chief Complaint  Patient presents with  . Hypertension    (Consider location/radiation/quality/duration/timing/severity/associated sxs/prior treatment) HPI Comments: Patient comes to the ER for evaluation of numbness and tingling in the left side of his face and ear. He reports that the symptoms were present when he woke this morning. He comes to the ER by ambulance. Blood pressure was elevated at 202/104. It appears to have improved during transport. Patient reports that the numbness and tingling has improved but not completely resolved on his face. He has a previous stroke had right-sided extremity symptoms.  She also reports that he has been having a lot of chest pain recently. He has seen a cardiologist for this and is scheduled for a stress test but has not happened. He might have missed the appointment. He has not, however, had any chest pain today.  Patient is a 57 y.o. male presenting with hypertension.  Hypertension Associated symptoms include chest pain.    Past Medical History  Diagnosis Date  . Chest pain 2007    Associated with syncope in 2011  . Hypertension     Echo in 2011-mild LVH, hyperdynamic LV function, mild to moderate AI  . Alcohol abuse   . Tobacco abuse     20 pack years; 1/2 pack per day  . Thrombocytopenia     possible alcohol toxicity; 122,000  . Chronic sinusitis     By CT  . Chest pain   . Stroke   . Depression   . Anxiety     Past Surgical History  Procedure Laterality Date  . Colonscopy    . Colonoscopy with esophagogastroduodenoscopy (egd) N/A 07/30/2012    NWG:NFAOZ hiatal hernia/Multiple rectal and colonic polyps/Internal hemorrhoids. Pancolonic diverticulosis    Family History  Problem Relation Age of Onset  . Hypertension    . Alcohol abuse Neg Hx   . Coronary artery disease Mother 13    CABG  .  Coronary artery disease Father 64    CABG  . Colon cancer Father 97    History  Substance Use Topics  . Smoking status: Current Every Day Smoker -- 1.00 packs/day for 40 years    Types: Cigarettes  . Smokeless tobacco: Never Used  . Alcohol Use: 15.0 oz/week    30 drink(s) per week     Comment: Multiple hospital admissions with chest pain and inebriation // states drinks 12 pck weekly      Review of Systems  Cardiovascular: Positive for chest pain.  Neurological: Positive for numbness.  All other systems reviewed and are negative.    Allergies  Review of patient's allergies indicates no known allergies.  Home Medications   Current Outpatient Rx  Name  Route  Sig  Dispense  Refill  . amLODipine (NORVASC) 10 MG tablet   Oral   Take 1 tablet (10 mg total) by mouth daily. For hypertension         . aspirin 325 MG tablet   Oral   Take 1 tablet (325 mg total) by mouth daily. Blood thinner for heart health         . atenolol (TENORMIN) 50 MG tablet   Oral   Take 1 tablet (50 mg total) by mouth daily. For CAD, Hypertension   30 tablet   0   . citalopram (CELEXA) 10 MG tablet  Oral   Take 1 tablet (10 mg total) by mouth daily. For depression   30 tablet   0   . cloNIDine (CATAPRES) 0.1 MG tablet   Oral   Take 1 tablet (0.1 mg total) by mouth 2 (two) times daily. For hypertension   60 tablet   0   . omeprazole (PRILOSEC) 20 MG capsule   Oral   Take 1 capsule (20 mg total) by mouth daily as needed (for acid reflux/GERD). For acid reflux   30 capsule      . traZODone (DESYREL) 50 MG tablet   Oral   Take 1 tablet (50 mg total) by mouth at bedtime and may repeat dose one time if needed. For depression/sleep   60 tablet   0     BP 153/79  Pulse 59  Temp(Src) 98.3 F (36.8 C) (Oral)  SpO2 93%  Physical Exam  Constitutional: He is oriented to person, place, and time. He appears well-developed and well-nourished. No distress.  HENT:  Head:  Normocephalic and atraumatic.  Right Ear: Hearing normal.  Nose: Nose normal.  Mouth/Throat: Oropharynx is clear and moist and mucous membranes are normal.  Eyes: Conjunctivae and EOM are normal. Pupils are equal, round, and reactive to light.  Neck: Normal range of motion. Neck supple.  Cardiovascular: Normal rate, regular rhythm, S1 normal and S2 normal.  Exam reveals no gallop and no friction rub.   No murmur heard. Pulmonary/Chest: Effort normal and breath sounds normal. No respiratory distress. He exhibits no tenderness.  Abdominal: Soft. Normal appearance and bowel sounds are normal. There is no hepatosplenomegaly. There is no tenderness. There is no rebound, no guarding, no tenderness at McBurney's point and negative Murphy's sign. No hernia.  Musculoskeletal: Normal range of motion.  Neurological: He is alert and oriented to person, place, and time. He has normal strength. No cranial nerve deficit or sensory deficit. Coordination normal. GCS eye subscore is 4. GCS verbal subscore is 5. GCS motor subscore is 6.  Skin: Skin is warm, dry and intact. No rash noted. No cyanosis.  Psychiatric: He has a normal mood and affect. His speech is normal and behavior is normal. Thought content normal.    ED Course  Procedures (including critical care time)  Labs Reviewed  CBC WITH DIFFERENTIAL  BASIC METABOLIC PANEL  TROPONIN I   Mr Brain Wo Contrast  09/15/2012  *RADIOLOGY REPORT*  Clinical Data: New left facial numbness.  MRI HEAD WITHOUT CONTRAST  Technique:  Multiplanar, multiecho pulse sequences of the brain and surrounding structures were obtained according to standard protocol without intravenous contrast.  Comparison: Head CT 08/23/2012.  MRI 08/20/2011.  Findings: Diffusion imaging does not show any acute or subacute infarction. There is an old infarction in the left side of the pons extending into the middle cerebellar peduncle.  No more peripheral cerebellar insult.  There is an old  lacunar infarction in the left thalamus.  There are minor chronic appearing small vessel changes within the hemispheric white matter.  No cortical or large vessel territory infarction.  No mass lesion, hemorrhage, hydrocephalus or extra-axial collection.  No pituitary mass.  No inflammatory sinus disease.  No skull or skull base lesion.  IMPRESSION: No acute infarction.  Old infarctions affecting the left pons/middle cerebellar peduncle, left thalamus and cerebral hemispheric white matter.   Original Report Authenticated By: Paulina Fusi, M.D.      Diagnosis: Peripheral neuropathy, face    MDM  This presents to the ER for evaluation  of numbness and tingling of the left ear and the left side of his face. There is no focal motor deficit noted. Patient has had a previous stroke. The remainder of his examination was unremarkable. He was reportedly hypotensive earlier her EMS, but the patient has significantly improved without intervention here. MRI was performed and was negative.  Patient also indicates that he has been having intermittent chest pain. He has been seeing a cardiologist for this. No chest pain today. Troponin negative.       Gilda Crease, MD 09/15/12 [phone]

## 2012-09-15 NOTE — ED Notes (Signed)
Per EMS - pt woke up approx 0300 this morning with tingling to left side of face. Hx of right sided weakness from previous CVA.   EMS reports hypertension with pressure 202/104 auscultated en route.  Pt also reports constant cp, had appt but missed for f/u.  Speech clear at this time.  Admits to drinking one beer today.

## 2012-11-12 ENCOUNTER — Encounter (HOSPITAL_COMMUNITY): Payer: Self-pay | Admitting: *Deleted

## 2012-11-12 ENCOUNTER — Emergency Department (HOSPITAL_COMMUNITY)
Admission: EM | Admit: 2012-11-12 | Discharge: 2012-11-12 | Disposition: A | Discharge status: Home / Self Care | Payer: MEDICAID | Attending: Emergency Medicine | Admitting: Emergency Medicine

## 2012-11-12 DIAGNOSIS — R45851 Suicidal ideations: Secondary | ICD-10-CM | POA: Insufficient documentation

## 2012-11-12 DIAGNOSIS — F1010 Alcohol abuse, uncomplicated: Secondary | ICD-10-CM | POA: Insufficient documentation

## 2012-11-12 DIAGNOSIS — F17200 Nicotine dependence, unspecified, uncomplicated: Secondary | ICD-10-CM | POA: Insufficient documentation

## 2012-11-12 DIAGNOSIS — Z8709 Personal history of other diseases of the respiratory system: Secondary | ICD-10-CM | POA: Insufficient documentation

## 2012-11-12 DIAGNOSIS — I10 Essential (primary) hypertension: Secondary | ICD-10-CM | POA: Insufficient documentation

## 2012-11-12 DIAGNOSIS — F32A Depression, unspecified: Secondary | ICD-10-CM

## 2012-11-12 DIAGNOSIS — Z7982 Long term (current) use of aspirin: Secondary | ICD-10-CM | POA: Insufficient documentation

## 2012-11-12 DIAGNOSIS — F3289 Other specified depressive episodes: Secondary | ICD-10-CM | POA: Insufficient documentation

## 2012-11-12 DIAGNOSIS — G8929 Other chronic pain: Secondary | ICD-10-CM | POA: Insufficient documentation

## 2012-11-12 DIAGNOSIS — Z79899 Other long term (current) drug therapy: Secondary | ICD-10-CM | POA: Insufficient documentation

## 2012-11-12 DIAGNOSIS — Z8673 Personal history of transient ischemic attack (TIA), and cerebral infarction without residual deficits: Secondary | ICD-10-CM | POA: Insufficient documentation

## 2012-11-12 DIAGNOSIS — R079 Chest pain, unspecified: Secondary | ICD-10-CM | POA: Insufficient documentation

## 2012-11-12 DIAGNOSIS — R51 Headache: Secondary | ICD-10-CM | POA: Insufficient documentation

## 2012-11-12 DIAGNOSIS — R209 Unspecified disturbances of skin sensation: Secondary | ICD-10-CM | POA: Insufficient documentation

## 2012-11-12 DIAGNOSIS — F329 Major depressive disorder, single episode, unspecified: Secondary | ICD-10-CM | POA: Insufficient documentation

## 2012-11-12 DIAGNOSIS — F411 Generalized anxiety disorder: Secondary | ICD-10-CM | POA: Insufficient documentation

## 2012-11-12 DIAGNOSIS — Z862 Personal history of diseases of the blood and blood-forming organs and certain disorders involving the immune mechanism: Secondary | ICD-10-CM | POA: Insufficient documentation

## 2012-11-12 NOTE — ED Notes (Signed)
EMS stated they were called out x 1 today for CP. Pt refused and was called to residence again by Emergency planning/management officer. Pt states he was on the phone with someone and became aggravated. Pt states chronic chest pain x 2 years (which he is seeing his PMD for)  and that he has been suicidal for years, but currently denies chest pain or SI at this time. Asked pt why he is here today and he stated, "They made me come." Pt states he has had "a couple" of beers today. Pt received 324 ASA en route.

## 2012-11-12 NOTE — ED Notes (Signed)
Pt upset that we can not give him a cab ride home. [employer] asked if they could and they stated they could not. Pt angrily walked out cursing "naw, Ill just walk home again". Offered pt socks. Pt states " i  dont want no damn socks to walk in".

## 2012-11-12 NOTE — ED Provider Notes (Signed)
History  This chart was scribed for Jesse Razor, MD by Ardelia Mems, ED Scribe. This patient was seen in room [facility] and the patient's care was started at 5:42 PM.   CSN: [id number]  Arrival date & time 11/12/12  1710     Chief Complaint  Patient presents with  . V70.1     The history is provided by the patient. No language interpreter was used.   HPI Comments: SHAMEL GERMOND is a 57 y.o. male with a h/o depression, alcohol abuse, chest pain and stroke brought by police to the Emergency Department with a need for psychiatric examination. Pt states that he had chest pain earlier today and someone called EMS on his behalf. Pt refused EMS care and was called to residence again by police officer. Pt states he was on the phone with someone and became aggravated. Pt states he has been through this before and just wants to go home. Pt states he has had chronic chest pain for 2 years and that he has been suicidal for years, but currently denies chest pain or SI at this time. Pt reports having sharp pain in the back of head since his stroke. Pt also reports right-sided numbness in the mornings. Pt states that he has poor balance and accidentally walks into things. Pt denies fever, chills, diarrhea, vomiting or any other symptoms. Pt has a h/o of EtOH abuse and has smoked 1 pack/day for 40 years.    PCP- Dr. Kizzie Furnish  Past Medical History  Diagnosis Date  . Chest pain 2007    Associated with syncope in 2011  . Hypertension     Echo in 2011-mild LVH, hyperdynamic LV function, mild to moderate AI  . Alcohol abuse   . Tobacco abuse     20 pack years; 1/2 pack per day  . Thrombocytopenia     possible alcohol toxicity; 122,000  . Chronic sinusitis     By CT  . Chest pain   . Stroke   . Depression   . Anxiety     Past Surgical History  Procedure Laterality Date  . Colonscopy    . Colonoscopy with esophagogastroduodenoscopy (egd) N/A 07/30/2012    NWG:NFAOZ hiatal  hernia/Multiple rectal and colonic polyps/Internal hemorrhoids. Pancolonic diverticulosis    Family History  Problem Relation Age of Onset  . Hypertension    . Alcohol abuse Neg Hx   . Coronary artery disease Mother 56    CABG  . Coronary artery disease Father 27    CABG  . Colon cancer Father 69    History  Substance Use Topics  . Smoking status: Current Every Day Smoker -- 1.00 packs/day for 40 years    Types: Cigarettes  . Smokeless tobacco: Never Used  . Alcohol Use: 15.0 oz/week    30 drink(s) per week     Comment: Multiple hospital admissions with chest pain and inebriation // states drinks 12 pck weekly      Review of Systems  Constitutional: Negative for fever and chills.  Cardiovascular: Positive for chest pain.  Gastrointestinal: Negative for nausea, vomiting and diarrhea.  Neurological: Positive for numbness and headaches.  Psychiatric/Behavioral: Positive for suicidal ideas.   A complete 10 system review of systems was obtained and all systems are negative except as noted in the HPI and PMH.    Allergies  Review of patient's allergies indicates no known allergies.  Home Medications   Current Outpatient Rx  Name  Route  Sig  Dispense  Refill  . amLODipine (NORVASC) 10 MG tablet   Oral   Take 1 tablet (10 mg total) by mouth daily. For hypertension         . aspirin 325 MG tablet   Oral   Take 1 tablet (325 mg total) by mouth daily. Blood thinner for heart health         . atenolol (TENORMIN) 50 MG tablet   Oral   Take 1 tablet (50 mg total) by mouth daily. For CAD, Hypertension   30 tablet   0   . citalopram (CELEXA) 10 MG tablet   Oral   Take 1 tablet (10 mg total) by mouth daily. For depression   30 tablet   0   . cloNIDine (CATAPRES) 0.1 MG tablet   Oral   Take 1 tablet (0.1 mg total) by mouth 2 (two) times daily. For hypertension   60 tablet   0   . omeprazole (PRILOSEC) 20 MG capsule   Oral   Take 1 capsule (20 mg total) by  mouth daily as needed (for acid reflux/GERD). For acid reflux   30 capsule      . traZODone (DESYREL) 50 MG tablet   Oral   Take 1 tablet (50 mg total) by mouth at bedtime and may repeat dose one time if needed. For depression/sleep   60 tablet   0     Triage Vitals: BP 130/63  Pulse 79  Resp 20  SpO2 91%  Physical Exam  Nursing note and vitals reviewed. Constitutional: He is oriented to person, place, and time. He appears well-developed and well-nourished.  HENT:  Head: Normocephalic and atraumatic.  Eyes: EOM are normal.  Neck: Normal range of motion.  Cardiovascular: Normal rate, regular rhythm, normal heart sounds and intact distal pulses.   Pulmonary/Chest: Effort normal and breath sounds normal. No respiratory distress.  Abdominal: Soft. He exhibits no distension. There is no tenderness.  Genitourinary: Rectum normal.  Musculoskeletal: Normal range of motion.  Neurological: He is alert and oriented to person, place, and time.  Skin: Skin is warm and dry.  Psychiatric: He has a normal mood and affect. Judgment normal.    ED Course  Procedures (including critical care time)  DIAGNOSTIC STUDIES: Oxygen Saturation is 91% on RA, low by my interpretation.    COORDINATION OF CARE: 5:55 PM- Pt advised of plan for treatment and pt agrees.     Labs Reviewed - No data to display No results found.   1. Depression       MDM  56yM with depression. Passive SI for years. Doesn't think he could actually act on it. No HI. No hallucinations. Offered to have speak with psychiatrist but he is declining.        I personally preformed the services scribed in my presence. The recorded information has been reviewed is accurate. Jesse Razor, MD.    Jesse Razor, MD 11/18/12 [phone]

## 2012-11-12 NOTE — ED Notes (Signed)
Pt called me back in there. States he is wanting to leave. He does not need to be checked out. edp aware, Dr Juleen China in now with pt

## 2012-11-13 ENCOUNTER — Encounter (HOSPITAL_COMMUNITY): Payer: Self-pay | Admitting: *Deleted

## 2012-11-13 ENCOUNTER — Emergency Department (HOSPITAL_COMMUNITY)
Admission: EM | Admit: 2012-11-13 | Discharge: 2012-11-13 | Discharge status: Against Medical Advice | Payer: Medicaid Other | Attending: Emergency Medicine | Admitting: Emergency Medicine

## 2012-11-13 DIAGNOSIS — I10 Essential (primary) hypertension: Secondary | ICD-10-CM | POA: Insufficient documentation

## 2012-11-13 DIAGNOSIS — S61209A Unspecified open wound of unspecified finger without damage to nail, initial encounter: Secondary | ICD-10-CM | POA: Insufficient documentation

## 2012-11-13 DIAGNOSIS — Y9389 Activity, other specified: Secondary | ICD-10-CM | POA: Insufficient documentation

## 2012-11-13 DIAGNOSIS — F1010 Alcohol abuse, uncomplicated: Secondary | ICD-10-CM | POA: Insufficient documentation

## 2012-11-13 DIAGNOSIS — F17200 Nicotine dependence, unspecified, uncomplicated: Secondary | ICD-10-CM | POA: Insufficient documentation

## 2012-11-13 DIAGNOSIS — Y9289 Other specified places as the place of occurrence of the external cause: Secondary | ICD-10-CM | POA: Insufficient documentation

## 2012-11-13 DIAGNOSIS — R296 Repeated falls: Secondary | ICD-10-CM | POA: Insufficient documentation

## 2012-11-13 DIAGNOSIS — S61409A Unspecified open wound of unspecified hand, initial encounter: Secondary | ICD-10-CM | POA: Insufficient documentation

## 2012-11-13 DIAGNOSIS — R42 Dizziness and giddiness: Secondary | ICD-10-CM | POA: Insufficient documentation

## 2012-11-13 NOTE — ED Notes (Signed)
Pt states with dizziness and then took a shower and fell out of shower, lac to left thumb and right hand, admits to drinking "a couple of beers", denies pain meds or street drugs

## 2012-11-13 NOTE — ED Notes (Signed)
Registration states that pt left

## 2012-11-13 NOTE — ED Notes (Signed)
Pt is barefooted, pt refuses to put our red socks on

## 2012-12-31 ENCOUNTER — Ambulatory Visit: Payer: Self-pay | Admitting: Gastroenterology

## 2013-01-22 ENCOUNTER — Ambulatory Visit: Payer: Self-pay | Admitting: Gastroenterology

## 2013-08-07 ENCOUNTER — Encounter: Payer: Self-pay | Admitting: Internal Medicine

## 2014-03-26 ENCOUNTER — Ambulatory Visit: Payer: MEDICAID | Admitting: Neurology

## 2014-12-10 ENCOUNTER — Encounter: Payer: Self-pay | Admitting: Cardiology

## 2014-12-10 NOTE — Progress Notes (Signed)
No show  This encounter was created in error - please disregard.

## 2015-05-11 ENCOUNTER — Emergency Department (HOSPITAL_COMMUNITY): Payer: Medicaid Other

## 2015-05-11 ENCOUNTER — Encounter (HOSPITAL_COMMUNITY): Payer: Self-pay

## 2015-05-11 ENCOUNTER — Emergency Department (HOSPITAL_COMMUNITY)
Admission: EM | Admit: 2015-05-11 | Discharge: 2015-05-11 | Discharge status: Against Medical Advice | Payer: Medicaid Other | Attending: Emergency Medicine | Admitting: Emergency Medicine

## 2015-05-11 DIAGNOSIS — Y9289 Other specified places as the place of occurrence of the external cause: Secondary | ICD-10-CM | POA: Diagnosis not present

## 2015-05-11 DIAGNOSIS — S299XXA Unspecified injury of thorax, initial encounter: Secondary | ICD-10-CM | POA: Insufficient documentation

## 2015-05-11 DIAGNOSIS — F17210 Nicotine dependence, cigarettes, uncomplicated: Secondary | ICD-10-CM | POA: Diagnosis not present

## 2015-05-11 DIAGNOSIS — W1839XA Other fall on same level, initial encounter: Secondary | ICD-10-CM | POA: Diagnosis not present

## 2015-05-11 DIAGNOSIS — I10 Essential (primary) hypertension: Secondary | ICD-10-CM | POA: Diagnosis not present

## 2015-05-11 DIAGNOSIS — Y998 Other external cause status: Secondary | ICD-10-CM | POA: Diagnosis not present

## 2015-05-11 DIAGNOSIS — Y9389 Activity, other specified: Secondary | ICD-10-CM | POA: Insufficient documentation

## 2015-05-11 DIAGNOSIS — S0990XA Unspecified injury of head, initial encounter: Secondary | ICD-10-CM | POA: Diagnosis not present

## 2015-05-11 NOTE — ED Notes (Signed)
Pt not in waiting room
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
## 2015-05-11 NOTE — ED Notes (Signed)
Called for pt x 3

## 2015-05-15 ENCOUNTER — Emergency Department (HOSPITAL_COMMUNITY)
Admission: EM | Admit: 2015-05-15 | Discharge: 2015-05-16 | Disposition: A | Discharge status: Home / Self Care | Payer: Medicaid Other | Attending: Emergency Medicine | Admitting: Emergency Medicine

## 2015-05-15 ENCOUNTER — Emergency Department (HOSPITAL_COMMUNITY): Payer: Medicaid Other

## 2015-05-15 ENCOUNTER — Encounter (HOSPITAL_COMMUNITY): Payer: Self-pay | Admitting: *Deleted

## 2015-05-15 DIAGNOSIS — Z7982 Long term (current) use of aspirin: Secondary | ICD-10-CM | POA: Diagnosis not present

## 2015-05-15 DIAGNOSIS — F17210 Nicotine dependence, cigarettes, uncomplicated: Secondary | ICD-10-CM | POA: Diagnosis not present

## 2015-05-15 DIAGNOSIS — R0781 Pleurodynia: Secondary | ICD-10-CM | POA: Diagnosis not present

## 2015-05-15 DIAGNOSIS — Z79899 Other long term (current) drug therapy: Secondary | ICD-10-CM | POA: Diagnosis not present

## 2015-05-15 DIAGNOSIS — Z8673 Personal history of transient ischemic attack (TIA), and cerebral infarction without residual deficits: Secondary | ICD-10-CM | POA: Insufficient documentation

## 2015-05-15 DIAGNOSIS — R079 Chest pain, unspecified: Secondary | ICD-10-CM

## 2015-05-15 DIAGNOSIS — R45851 Suicidal ideations: Secondary | ICD-10-CM | POA: Diagnosis not present

## 2015-05-15 DIAGNOSIS — I10 Essential (primary) hypertension: Secondary | ICD-10-CM | POA: Insufficient documentation

## 2015-05-15 DIAGNOSIS — Z8709 Personal history of other diseases of the respiratory system: Secondary | ICD-10-CM | POA: Diagnosis not present

## 2015-05-15 DIAGNOSIS — Z8719 Personal history of other diseases of the digestive system: Secondary | ICD-10-CM | POA: Diagnosis not present

## 2015-05-15 DIAGNOSIS — F419 Anxiety disorder, unspecified: Secondary | ICD-10-CM | POA: Diagnosis not present

## 2015-05-15 DIAGNOSIS — F329 Major depressive disorder, single episode, unspecified: Secondary | ICD-10-CM | POA: Insufficient documentation

## 2015-05-15 DIAGNOSIS — Z008 Encounter for other general examination: Secondary | ICD-10-CM | POA: Diagnosis present

## 2015-05-15 DIAGNOSIS — Z862 Personal history of diseases of the blood and blood-forming organs and certain disorders involving the immune mechanism: Secondary | ICD-10-CM | POA: Diagnosis not present

## 2015-05-15 DIAGNOSIS — F1910 Other psychoactive substance abuse, uncomplicated: Secondary | ICD-10-CM | POA: Diagnosis not present

## 2015-05-15 LAB — COMPREHENSIVE METABOLIC PANEL
ALT: 45 U/L (ref 17–63)
ANION GAP: 10 (ref 5–15)
AST: 71 U/L — AB (ref 15–41)
Albumin: 3.9 g/dL (ref 3.5–5.0)
Alkaline Phosphatase: 86 U/L (ref 38–126)
BUN: 7 mg/dL (ref 6–20)
CHLORIDE: 108 mmol/L (ref 101–111)
CO2: 26 mmol/L (ref 22–32)
Calcium: 8.6 mg/dL — ABNORMAL LOW (ref 8.9–10.3)
Creatinine, Ser: 0.69 mg/dL (ref 0.61–1.24)
Glucose, Bld: 115 mg/dL — ABNORMAL HIGH (ref 65–99)
POTASSIUM: 3.9 mmol/L (ref 3.5–5.1)
Sodium: 144 mmol/L (ref 135–145)
Total Bilirubin: 0.5 mg/dL (ref 0.3–1.2)
Total Protein: 8.4 g/dL — ABNORMAL HIGH (ref 6.5–8.1)

## 2015-05-15 LAB — RAPID URINE DRUG SCREEN, HOSP PERFORMED
AMPHETAMINES: NOT DETECTED
BARBITURATES: NOT DETECTED
Benzodiazepines: NOT DETECTED
COCAINE: NOT DETECTED
Opiates: NOT DETECTED
TETRAHYDROCANNABINOL: NOT DETECTED

## 2015-05-15 LAB — URINALYSIS, ROUTINE W REFLEX MICROSCOPIC
BILIRUBIN URINE: NEGATIVE
GLUCOSE, UA: NEGATIVE mg/dL
KETONES UR: NEGATIVE mg/dL
Leukocytes, UA: NEGATIVE
NITRITE: NEGATIVE
PH: 5.5 (ref 5.0–8.0)
Protein, ur: NEGATIVE mg/dL

## 2015-05-15 LAB — CBC WITH DIFFERENTIAL/PLATELET
Basophils Absolute: 0.1 10*3/uL (ref 0.0–0.1)
Basophils Relative: 1 %
EOS PCT: 4 %
Eosinophils Absolute: 0.3 10*3/uL (ref 0.0–0.7)
HCT: 46.8 % (ref 39.0–52.0)
Hemoglobin: 16.5 g/dL (ref 13.0–17.0)
LYMPHS ABS: 2.9 10*3/uL (ref 0.7–4.0)
LYMPHS PCT: 42 %
MCH: 33.3 pg (ref 26.0–34.0)
MCHC: 35.3 g/dL (ref 30.0–36.0)
MCV: 94.5 fL (ref 78.0–100.0)
MONO ABS: 0.4 10*3/uL (ref 0.1–1.0)
Monocytes Relative: 5 %
Neutro Abs: 3.3 10*3/uL (ref 1.7–7.7)
Neutrophils Relative %: 48 %
PLATELETS: 216 10*3/uL (ref 150–400)
RBC: 4.95 MIL/uL (ref 4.22–5.81)
RDW: 13.1 % (ref 11.5–15.5)
WBC: 7 10*3/uL (ref 4.0–10.5)

## 2015-05-15 LAB — ACETAMINOPHEN LEVEL: Acetaminophen (Tylenol), Serum: <10 ug/mL — ABNORMAL LOW (ref 10–30)

## 2015-05-15 LAB — URINE MICROSCOPIC-ADD ON

## 2015-05-15 LAB — ETHANOL: Alcohol, Ethyl (B): 289 mg/dL — ABNORMAL HIGH (ref <5)

## 2015-05-15 LAB — TROPONIN I: Troponin I: <0.03 ng/mL (ref <0.031)

## 2015-05-15 LAB — SALICYLATE LEVEL

## 2015-05-15 MED ORDER — CLONIDINE HCL 0.1 MG PO TABS
0.1000 mg | ORAL_TABLET | Freq: Two times a day (BID) | ORAL | Status: DC
  Administered 2015-05-16: 0.1 mg via ORAL
  Filled 2015-05-15: qty 1

## 2015-05-15 MED ORDER — ASPIRIN 325 MG PO TABS
325.0000 mg | ORAL_TABLET | Freq: Every day | ORAL | Status: DC
  Administered 2015-05-16: 325 mg via ORAL
  Filled 2015-05-15: qty 1

## 2015-05-15 MED ORDER — IBUPROFEN 400 MG PO TABS: 600.0000 mg | ORAL_TABLET | Freq: Three times a day (TID) | ORAL | Status: DC | PRN

## 2015-05-15 MED ORDER — AMLODIPINE BESYLATE 5 MG PO TABS
10.0000 mg | ORAL_TABLET | Freq: Every day | ORAL | Status: DC
  Administered 2015-05-16: 10 mg via ORAL
  Filled 2015-05-15: qty 2

## 2015-05-15 MED ORDER — ONDANSETRON HCL 4 MG PO TABS: 4.0000 mg | ORAL_TABLET | Freq: Three times a day (TID) | ORAL | Status: DC | PRN

## 2015-05-15 MED ORDER — LORAZEPAM 1 MG PO TABS: 0.0000 mg | ORAL_TABLET | Freq: Two times a day (BID) | ORAL | Status: DC

## 2015-05-15 MED ORDER — LORAZEPAM 1 MG PO TABS: 0.0000 mg | ORAL_TABLET | Freq: Four times a day (QID) | ORAL | Status: DC

## 2015-05-15 MED ORDER — PANTOPRAZOLE SODIUM 40 MG PO TBEC
40.0000 mg | DELAYED_RELEASE_TABLET | Freq: Every day | ORAL | Status: DC
  Administered 2015-05-16: 40 mg via ORAL
  Filled 2015-05-15: qty 1

## 2015-05-15 MED ORDER — LORAZEPAM 1 MG PO TABS: 1.0000 mg | ORAL_TABLET | Freq: Three times a day (TID) | ORAL | Status: DC | PRN

## 2015-05-15 MED ORDER — TRAZODONE HCL 50 MG PO TABS: 50.0000 mg | ORAL_TABLET | Freq: Every evening | ORAL | Status: DC | PRN

## 2015-05-15 MED ORDER — NICOTINE 21 MG/24HR TD PT24
21.0000 mg | MEDICATED_PATCH | Freq: Every day | TRANSDERMAL | Status: DC
  Administered 2015-05-16: 21 mg via TRANSDERMAL
  Filled 2015-05-15: qty 1

## 2015-05-15 MED ORDER — ATENOLOL 25 MG PO TABS
50.0000 mg | ORAL_TABLET | Freq: Every day | ORAL | Status: DC
  Administered 2015-05-16: 50 mg via ORAL
  Filled 2015-05-15: qty 2

## 2015-05-15 MED ORDER — CITALOPRAM HYDROBROMIDE 10 MG PO TABS
10.0000 mg | ORAL_TABLET | Freq: Every day | ORAL | Status: DC
  Administered 2015-05-16: 10 mg via ORAL
  Filled 2015-05-15 (×3): qty 1

## 2015-05-15 MED ORDER — ACETAMINOPHEN 325 MG PO TABS: 650.0000 mg | ORAL_TABLET | ORAL | Status: DC | PRN

## 2015-05-15 MED ORDER — ALUM & MAG HYDROXIDE-SIMETH 200-200-20 MG/5ML PO SUSP
30.0000 mL | ORAL | Status: DC | PRN
Start: 2015-05-15 — End: 2015-05-16

## 2015-05-15 NOTE — ED Notes (Signed)
Pt arrived to er by [facility] with c/o generalized chest pain described as intermittent, sharp in nature, pt states that he has been having the problems with the pain for "awhile" admits to etoh,

## 2015-05-15 NOTE — ED Notes (Signed)
TTS in process 
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
## 2015-05-15 NOTE — ED Notes (Signed)
Pt admits to having thoughts of wanting to harm himself, last episode was today, unsure of plan ,

## 2015-05-16 DIAGNOSIS — R45851 Suicidal ideations: Secondary | ICD-10-CM | POA: Diagnosis not present

## 2015-05-16 DIAGNOSIS — F1910 Other psychoactive substance abuse, uncomplicated: Secondary | ICD-10-CM

## 2015-05-16 MED ORDER — ALBUTEROL SULFATE HFA 108 (90 BASE) MCG/ACT IN AERS
2.0000 | INHALATION_SPRAY | Freq: Four times a day (QID) | RESPIRATORY_TRACT | Status: DC
  Administered 2015-05-16 (×2): 2 via RESPIRATORY_TRACT
  Filled 2015-05-16: qty 6.7

## 2015-05-16 NOTE — Progress Notes (Signed)
Patient given inhaler by nurse , patient takes at home.

## 2015-05-16 NOTE — ED Provider Notes (Signed)
Mary, TSS, states patient meets inpatient criteria, pt may not want to stay voluntarily. No beds available at this time, will seek placement.   Rolland Porter, MD 05/16/15 [phone]

## 2015-05-16 NOTE — [facility] Counselor (Signed)
Pt intake package sent to the following facilities for consideration for admission:  [place], [place], [facility], [place] Triage Specialist [facility]
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
## 2015-05-16 NOTE — ED Notes (Signed)
MD at bedside. 

## 2015-05-16 NOTE — ED Notes (Signed)
Pt ambulated to restroom & returned to room w/ no complications. 

## 2015-05-16 NOTE — ED Provider Notes (Signed)
The patient has been observed overnight and through the day, he has been evaluated by TTS psychiatric evaluation service, he is not suicidal, I have had a sitdown discussion with the patient and he states that he has no suicidal thoughts at all, he is no longer feeling depressed, he is not having chest pain, he has follow-up with the family doctor. At this time I believe the patient to be not suicidal and stable for discharge. He states that he will follow-up closely and will investigate therapeutic evaluation with psychiatry.  Noemi Chapel, MD 05/16/15 [phone]

## 2015-05-16 NOTE — ED Notes (Signed)
Pt states he is not sure why he is still here.  Advised pt that he needed psych placement.  Pt states he doesn't know why.  Advised pt that he notified us that he was thinking about hurting hisself when he first arrived last night.

## 2015-05-16 NOTE — Discharge Instructions (Signed)

## 2015-05-16 NOTE — ED Notes (Signed)
Pt resting calmly w/ eyes closed. Rise & fall of the chest noted. Sitter at bedside. Bed in low position, side rails up x2. NAD noted at this time.

## 2015-07-25 ENCOUNTER — Encounter: Payer: Self-pay | Admitting: Internal Medicine

## 2016-12-07 ENCOUNTER — Encounter (HOSPITAL_COMMUNITY): Payer: Self-pay

## 2016-12-07 ENCOUNTER — Inpatient Hospital Stay (HOSPITAL_COMMUNITY)
Admission: EM | Admit: 2016-12-07 | Discharge: 2017-01-02 | DRG: 432 | Disposition: E | Payer: Medicaid Other | Attending: Family Medicine | Admitting: Family Medicine

## 2016-12-07 ENCOUNTER — Emergency Department (HOSPITAL_COMMUNITY): Payer: Medicaid Other

## 2016-12-07 DIAGNOSIS — J9621 Acute and chronic respiratory failure with hypoxia: Secondary | ICD-10-CM

## 2016-12-07 DIAGNOSIS — R402362 Coma scale, best motor response, obeys commands, at arrival to emergency department: Secondary | ICD-10-CM | POA: Diagnosis present

## 2016-12-07 DIAGNOSIS — F10239 Alcohol dependence with withdrawal, unspecified: Secondary | ICD-10-CM | POA: Diagnosis present

## 2016-12-07 DIAGNOSIS — N179 Acute kidney failure, unspecified: Secondary | ICD-10-CM | POA: Diagnosis not present

## 2016-12-07 DIAGNOSIS — R195 Other fecal abnormalities: Secondary | ICD-10-CM | POA: Diagnosis not present

## 2016-12-07 DIAGNOSIS — R188 Other ascites: Secondary | ICD-10-CM

## 2016-12-07 DIAGNOSIS — K7011 Alcoholic hepatitis with ascites: Secondary | ICD-10-CM | POA: Diagnosis present

## 2016-12-07 DIAGNOSIS — D500 Iron deficiency anemia secondary to blood loss (chronic): Secondary | ICD-10-CM | POA: Diagnosis not present

## 2016-12-07 DIAGNOSIS — R402252 Coma scale, best verbal response, oriented, at arrival to emergency department: Secondary | ICD-10-CM | POA: Diagnosis present

## 2016-12-07 DIAGNOSIS — Z8249 Family history of ischemic heart disease and other diseases of the circulatory system: Secondary | ICD-10-CM | POA: Diagnosis not present

## 2016-12-07 DIAGNOSIS — K7031 Alcoholic cirrhosis of liver with ascites: Secondary | ICD-10-CM | POA: Diagnosis present

## 2016-12-07 DIAGNOSIS — Z7189 Other specified counseling: Secondary | ICD-10-CM | POA: Diagnosis not present

## 2016-12-07 DIAGNOSIS — R402142 Coma scale, eyes open, spontaneous, at arrival to emergency department: Secondary | ICD-10-CM | POA: Diagnosis present

## 2016-12-07 DIAGNOSIS — R748 Abnormal levels of other serum enzymes: Secondary | ICD-10-CM | POA: Diagnosis not present

## 2016-12-07 DIAGNOSIS — K219 Gastro-esophageal reflux disease without esophagitis: Secondary | ICD-10-CM | POA: Diagnosis present

## 2016-12-07 DIAGNOSIS — D696 Thrombocytopenia, unspecified: Secondary | ICD-10-CM | POA: Diagnosis present

## 2016-12-07 DIAGNOSIS — I8500 Esophageal varices without bleeding: Secondary | ICD-10-CM | POA: Diagnosis present

## 2016-12-07 DIAGNOSIS — Z7982 Long term (current) use of aspirin: Secondary | ICD-10-CM

## 2016-12-07 DIAGNOSIS — J690 Pneumonitis due to inhalation of food and vomit: Secondary | ICD-10-CM | POA: Diagnosis not present

## 2016-12-07 DIAGNOSIS — I10 Essential (primary) hypertension: Secondary | ICD-10-CM | POA: Diagnosis present

## 2016-12-07 DIAGNOSIS — J80 Acute respiratory distress syndrome: Secondary | ICD-10-CM

## 2016-12-07 DIAGNOSIS — F10288 Alcohol dependence with other alcohol-induced disorder: Secondary | ICD-10-CM | POA: Diagnosis not present

## 2016-12-07 DIAGNOSIS — Z515 Encounter for palliative care: Secondary | ICD-10-CM | POA: Diagnosis not present

## 2016-12-07 DIAGNOSIS — J449 Chronic obstructive pulmonary disease, unspecified: Secondary | ICD-10-CM | POA: Diagnosis present

## 2016-12-07 DIAGNOSIS — D689 Coagulation defect, unspecified: Secondary | ICD-10-CM | POA: Diagnosis present

## 2016-12-07 DIAGNOSIS — H9190 Unspecified hearing loss, unspecified ear: Secondary | ICD-10-CM | POA: Diagnosis present

## 2016-12-07 DIAGNOSIS — J329 Chronic sinusitis, unspecified: Secondary | ICD-10-CM | POA: Diagnosis present

## 2016-12-07 DIAGNOSIS — K766 Portal hypertension: Secondary | ICD-10-CM | POA: Diagnosis present

## 2016-12-07 DIAGNOSIS — I509 Heart failure, unspecified: Secondary | ICD-10-CM

## 2016-12-07 DIAGNOSIS — E871 Hypo-osmolality and hyponatremia: Secondary | ICD-10-CM | POA: Diagnosis present

## 2016-12-07 DIAGNOSIS — F1020 Alcohol dependence, uncomplicated: Secondary | ICD-10-CM | POA: Diagnosis present

## 2016-12-07 DIAGNOSIS — I864 Gastric varices: Secondary | ICD-10-CM | POA: Diagnosis present

## 2016-12-07 DIAGNOSIS — J9601 Acute respiratory failure with hypoxia: Secondary | ICD-10-CM

## 2016-12-07 DIAGNOSIS — I2510 Atherosclerotic heart disease of native coronary artery without angina pectoris: Secondary | ICD-10-CM | POA: Diagnosis not present

## 2016-12-07 DIAGNOSIS — R339 Retention of urine, unspecified: Secondary | ICD-10-CM | POA: Diagnosis not present

## 2016-12-07 DIAGNOSIS — Z66 Do not resuscitate: Secondary | ICD-10-CM | POA: Diagnosis not present

## 2016-12-07 DIAGNOSIS — E872 Acidosis: Secondary | ICD-10-CM | POA: Diagnosis present

## 2016-12-07 DIAGNOSIS — K7290 Hepatic failure, unspecified without coma: Secondary | ICD-10-CM | POA: Diagnosis present

## 2016-12-07 DIAGNOSIS — Z800 Family history of malignant neoplasm of digestive organs: Secondary | ICD-10-CM | POA: Diagnosis not present

## 2016-12-07 DIAGNOSIS — R0602 Shortness of breath: Secondary | ICD-10-CM | POA: Diagnosis present

## 2016-12-07 DIAGNOSIS — I69351 Hemiplegia and hemiparesis following cerebral infarction affecting right dominant side: Secondary | ICD-10-CM

## 2016-12-07 DIAGNOSIS — E8770 Fluid overload, unspecified: Secondary | ICD-10-CM | POA: Diagnosis present

## 2016-12-07 DIAGNOSIS — I700 Atherosclerosis of aorta: Secondary | ICD-10-CM | POA: Diagnosis present

## 2016-12-07 DIAGNOSIS — I959 Hypotension, unspecified: Secondary | ICD-10-CM | POA: Diagnosis not present

## 2016-12-07 DIAGNOSIS — F17210 Nicotine dependence, cigarettes, uncomplicated: Secondary | ICD-10-CM | POA: Diagnosis present

## 2016-12-07 LAB — POC OCCULT BLOOD, ED: Fecal Occult Bld: POSITIVE — AB

## 2016-12-07 LAB — COMPREHENSIVE METABOLIC PANEL
ALT: 36 U/L (ref 17–63)
ANION GAP: 6 (ref 5–15)
AST: 87 U/L — ABNORMAL HIGH (ref 15–41)
Albumin: 2 g/dL — ABNORMAL LOW (ref 3.5–5.0)
Alkaline Phosphatase: 78 U/L (ref 38–126)
BUN: 27 mg/dL — ABNORMAL HIGH (ref 6–20)
CHLORIDE: 99 mmol/L — AB (ref 101–111)
CO2: 22 mmol/L (ref 22–32)
Calcium: 7.9 mg/dL — ABNORMAL LOW (ref 8.9–10.3)
Creatinine, Ser: 0.88 mg/dL (ref 0.61–1.24)
GFR calc Af Amer: >60 mL/min (ref >60)
Glucose, Bld: 120 mg/dL — ABNORMAL HIGH (ref 65–99)
Potassium: 4.4 mmol/L (ref 3.5–5.1)
SODIUM: 127 mmol/L — AB (ref 135–145)
Total Bilirubin: 7.1 mg/dL — ABNORMAL HIGH (ref 0.3–1.2)
Total Protein: 6.7 g/dL (ref 6.5–8.1)

## 2016-12-07 LAB — CBC WITH DIFFERENTIAL/PLATELET
BASOS ABS: 0 10*3/uL (ref 0.0–0.1)
BASOS PCT: 0 %
EOS ABS: 0.1 10*3/uL (ref 0.0–0.7)
Eosinophils Relative: 1 %
HEMATOCRIT: 23.9 % — AB (ref 39.0–52.0)
HEMOGLOBIN: 8.3 g/dL — AB (ref 13.0–17.0)
Lymphocytes Relative: 8 %
Lymphs Abs: 0.7 10*3/uL (ref 0.7–4.0)
MCH: 34.6 pg — ABNORMAL HIGH (ref 26.0–34.0)
MCHC: 34.7 g/dL (ref 30.0–36.0)
MCV: 99.6 fL (ref 78.0–100.0)
MONO ABS: 0.7 10*3/uL (ref 0.1–1.0)
MONOS PCT: 8 %
NEUTROS ABS: 7.3 10*3/uL (ref 1.7–7.7)
NEUTROS PCT: 83 %
Platelets: 95 10*3/uL — ABNORMAL LOW (ref 150–400)
RBC: 2.4 MIL/uL — ABNORMAL LOW (ref 4.22–5.81)
RDW: 16.1 % — AB (ref 11.5–15.5)
WBC: 8.8 10*3/uL (ref 4.0–10.5)

## 2016-12-07 LAB — URINALYSIS, ROUTINE W REFLEX MICROSCOPIC
Bacteria, UA: NONE SEEN
GLUCOSE, UA: NEGATIVE mg/dL
Ketones, ur: NEGATIVE mg/dL
LEUKOCYTES UA: NEGATIVE
NITRITE: NEGATIVE
Protein, ur: NEGATIVE mg/dL
Specific Gravity, Urine: 1.04 — ABNORMAL HIGH (ref 1.005–1.030)
pH: 5 (ref 5.0–8.0)

## 2016-12-07 LAB — RETICULOCYTES
RBC.: 2.45 MIL/uL — ABNORMAL LOW (ref 4.22–5.81)
RETIC CT PCT: 4.1 % — AB (ref 0.4–3.1)
Retic Count, Absolute: 100.5 10*3/uL (ref 19.0–186.0)

## 2016-12-07 LAB — LIPASE, BLOOD: LIPASE: 53 U/L — AB (ref 11–51)

## 2016-12-07 LAB — FERRITIN: Ferritin: 583 ng/mL — ABNORMAL HIGH (ref 24–336)

## 2016-12-07 LAB — ABO/RH: ABO/RH(D): O POS

## 2016-12-07 LAB — IRON AND TIBC
IRON: 102 ug/dL (ref 45–182)
SATURATION RATIOS: 97 % — AB (ref 17.9–39.5)
TIBC: 105 ug/dL — AB (ref 250–450)
UIBC: 3 ug/dL

## 2016-12-07 LAB — TROPONIN I

## 2016-12-07 LAB — FOLATE: FOLATE: 6.6 ng/mL (ref >5.9)

## 2016-12-07 LAB — BRAIN NATRIURETIC PEPTIDE: B NATRIURETIC PEPTIDE 5: 125 pg/mL — AB (ref 0.0–100.0)

## 2016-12-07 LAB — VITAMIN B12: VITAMIN B 12: 893 pg/mL (ref 180–914)

## 2016-12-07 LAB — PREPARE RBC (CROSSMATCH)

## 2016-12-07 MED ORDER — LORAZEPAM 2 MG/ML IJ SOLN
0.0000 mg | Freq: Four times a day (QID) | INTRAMUSCULAR | Status: DC
  Administered 2016-12-09: 2 mg via INTRAVENOUS
  Filled 2016-12-07: qty 1

## 2016-12-07 MED ORDER — SODIUM CHLORIDE 0.9% FLUSH
3.0000 mL | Freq: Two times a day (BID) | INTRAVENOUS | Status: DC
  Administered 2016-12-07 – 2016-12-10 (×7): 3 mL via INTRAVENOUS

## 2016-12-07 MED ORDER — ACETAMINOPHEN 325 MG PO TABS: 650.0000 mg | ORAL_TABLET | ORAL | Status: DC | PRN

## 2016-12-07 MED ORDER — IOPAMIDOL (ISOVUE-300) INJECTION 61%
100.0000 mL | Freq: Once | INTRAVENOUS | Status: AC | PRN
Start: 2016-12-07 — End: 2016-12-07
  Administered 2016-12-07: 100 mL via INTRAVENOUS

## 2016-12-07 MED ORDER — ONDANSETRON HCL 4 MG/2ML IJ SOLN: 4.0000 mg | Freq: Four times a day (QID) | INTRAMUSCULAR | Status: DC | PRN

## 2016-12-07 MED ORDER — LISINOPRIL 10 MG PO TABS
10.0000 mg | ORAL_TABLET | Freq: Every day | ORAL | Status: DC
  Administered 2016-12-08 – 2016-12-09 (×2): 10 mg via ORAL
  Filled 2016-12-07 (×2): qty 1

## 2016-12-07 MED ORDER — THIAMINE HCL 100 MG/ML IJ SOLN
100.0000 mg | Freq: Every day | INTRAMUSCULAR | Status: DC
  Administered 2016-12-07 – 2016-12-10 (×2): 100 mg via INTRAVENOUS
  Filled 2016-12-07 (×2): qty 2

## 2016-12-07 MED ORDER — ONDANSETRON HCL 4 MG PO TABS
4.0000 mg | ORAL_TABLET | Freq: Once | ORAL | Status: AC
  Administered 2016-12-07: 4 mg via ORAL
  Filled 2016-12-07: qty 1

## 2016-12-07 MED ORDER — SODIUM CHLORIDE 0.9% FLUSH
3.0000 mL | INTRAVENOUS | Status: DC | PRN
Start: 2016-12-07 — End: 2016-12-11

## 2016-12-07 MED ORDER — ALBUTEROL SULFATE (2.5 MG/3ML) 0.083% IN NEBU: 3.0000 mL | INHALATION_SOLUTION | RESPIRATORY_TRACT | Status: DC | PRN

## 2016-12-07 MED ORDER — LORAZEPAM 1 MG PO TABS: 1.0000 mg | ORAL_TABLET | Freq: Four times a day (QID) | ORAL | Status: DC | PRN

## 2016-12-07 MED ORDER — ADULT MULTIVITAMIN W/MINERALS CH
1.0000 | ORAL_TABLET | Freq: Every day | ORAL | Status: DC
  Administered 2016-12-07 – 2016-12-09 (×3): 1 via ORAL
  Filled 2016-12-07 (×3): qty 1

## 2016-12-07 MED ORDER — CLONIDINE HCL 0.1 MG PO TABS
0.1000 mg | ORAL_TABLET | Freq: Two times a day (BID) | ORAL | Status: DC
  Administered 2016-12-07 – 2016-12-09 (×4): 0.1 mg via ORAL
  Filled 2016-12-07 (×4): qty 1

## 2016-12-07 MED ORDER — HYDROCODONE-ACETAMINOPHEN 5-325 MG PO TABS
1.0000 | ORAL_TABLET | Freq: Four times a day (QID) | ORAL | Status: DC | PRN
Start: 2016-12-07 — End: 2016-12-08
  Administered 2016-12-08: 2 via ORAL
  Filled 2016-12-07: qty 2

## 2016-12-07 MED ORDER — SODIUM CHLORIDE 0.9 % IV SOLN
Freq: Once | INTRAVENOUS | Status: AC
  Administered 2016-12-07: 22:00:00 via INTRAVENOUS

## 2016-12-07 MED ORDER — MORPHINE SULFATE (PF) 4 MG/ML IV SOLN
4.0000 mg | Freq: Once | INTRAVENOUS | Status: AC
  Administered 2016-12-07: 4 mg via INTRAVENOUS
  Filled 2016-12-07: qty 1

## 2016-12-07 MED ORDER — ONDANSETRON HCL 4 MG/2ML IJ SOLN
4.0000 mg | Freq: Once | INTRAMUSCULAR | Status: AC
  Administered 2016-12-07: 4 mg via INTRAVENOUS
  Filled 2016-12-07: qty 2

## 2016-12-07 MED ORDER — SODIUM CHLORIDE 0.9 % IV SOLN: 250.0000 mL | INTRAVENOUS | Status: DC | PRN

## 2016-12-07 MED ORDER — FUROSEMIDE 10 MG/ML IJ SOLN
40.0000 mg | Freq: Two times a day (BID) | INTRAMUSCULAR | Status: DC
  Administered 2016-12-07 – 2016-12-09 (×4): 40 mg via INTRAVENOUS
  Filled 2016-12-07 (×4): qty 4

## 2016-12-07 MED ORDER — ATENOLOL 25 MG PO TABS
50.0000 mg | ORAL_TABLET | Freq: Every day | ORAL | Status: DC
  Administered 2016-12-07 – 2016-12-09 (×3): 50 mg via ORAL
  Filled 2016-12-07 (×3): qty 2

## 2016-12-07 MED ORDER — FOLIC ACID 1 MG PO TABS
1.0000 mg | ORAL_TABLET | Freq: Every day | ORAL | Status: DC
  Administered 2016-12-07 – 2016-12-09 (×3): 1 mg via ORAL
  Filled 2016-12-07 (×3): qty 1

## 2016-12-07 MED ORDER — VITAMIN B-1 100 MG PO TABS
100.0000 mg | ORAL_TABLET | Freq: Every day | ORAL | Status: DC
  Administered 2016-12-08 – 2016-12-09 (×2): 100 mg via ORAL
  Filled 2016-12-07 (×3): qty 1

## 2016-12-07 MED ORDER — IPRATROPIUM-ALBUTEROL 0.5-2.5 (3) MG/3ML IN SOLN
3.0000 mL | Freq: Once | RESPIRATORY_TRACT | Status: AC
  Administered 2016-12-07: 3 mL via RESPIRATORY_TRACT
  Filled 2016-12-07: qty 3

## 2016-12-07 MED ORDER — LORAZEPAM 2 MG/ML IJ SOLN: 1.0000 mg | Freq: Four times a day (QID) | INTRAMUSCULAR | Status: DC | PRN

## 2016-12-07 MED ORDER — LORAZEPAM 2 MG/ML IJ SOLN: 0.0000 mg | Freq: Two times a day (BID) | INTRAMUSCULAR | Status: DC

## 2016-12-07 MED ORDER — POTASSIUM CHLORIDE CRYS ER 20 MEQ PO TBCR
20.0000 meq | EXTENDED_RELEASE_TABLET | Freq: Two times a day (BID) | ORAL | Status: DC
  Administered 2016-12-07 – 2016-12-08 (×2): 20 meq via ORAL
  Filled 2016-12-07 (×2): qty 1

## 2016-12-07 MED ORDER — FUROSEMIDE 10 MG/ML IJ SOLN: 20.0000 mg | Freq: Once | INTRAMUSCULAR | Status: DC

## 2016-12-07 MED ORDER — MORPHINE SULFATE (PF) 2 MG/ML IV SOLN
2.0000 mg | Freq: Once | INTRAVENOUS | Status: AC
  Administered 2016-12-07: 2 mg via INTRAVENOUS
  Filled 2016-12-07: qty 1

## 2016-12-07 MED ORDER — DIPHENHYDRAMINE HCL 25 MG PO CAPS
25.0000 mg | ORAL_CAPSULE | Freq: Once | ORAL | Status: AC
  Administered 2016-12-07: 25 mg via ORAL
  Filled 2016-12-07: qty 1

## 2016-12-07 MED ORDER — ACETAMINOPHEN 325 MG PO TABS
650.0000 mg | ORAL_TABLET | Freq: Once | ORAL | Status: AC
  Administered 2016-12-07: 650 mg via ORAL
  Filled 2016-12-07: qty 2

## 2016-12-07 MED ORDER — PANTOPRAZOLE SODIUM 40 MG IV SOLR
40.0000 mg | Freq: Two times a day (BID) | INTRAVENOUS | Status: DC
  Administered 2016-12-07 – 2016-12-10 (×8): 40 mg via INTRAVENOUS
  Filled 2016-12-07 (×8): qty 40

## 2016-12-07 NOTE — ED Notes (Signed)
Attempted to call report to [phone] again and was told nurses are in report and they will call back  

## 2016-12-07 NOTE — ED Provider Notes (Signed)
[facility] DEPT Provider Note   CSN: [id number] Arrival date & time: 01/01/2017  6294     History   Chief Complaint Chief Complaint  Patient presents with  . Shortness of Breath    HPI Jesse Stafford is a 61 y.o. male.  Patient is a 61 year old male who presents to the emergency department with a complaint of shortness of breath, back pain, and weakness.  The patient was brought to the emergency department by EMS. He complains of increasing shortness of breath. He states he doesn't do a lot of walking, but he now has shortness of breath with getting around in the home and out in the yard. He complains of hurting everywhere, but particularly his lower back. He reports having swelling of his abdomen and his legs. He has some swelling of his abdomen from time to time, but states that it is never been this bad. The patient denies vomiting, but states he's been nauseous. He denies any blood in his stools. He denies any blood in his urine. He admits to history of liver problems. He states he continues to drink. He had one and a half 40 oz Malt liquor on last night. He had another half of the same about 3:00 this morning. Nothing makes his pain any better. Movement and walking make the pain worse.   The history is provided by the patient.    Past Medical History:  Diagnosis Date  . Alcohol abuse   . Anxiety   . Chronic sinusitis   . Depression   . Diverticulosis   . Essential hypertension    Echo in 2011-mild LVH, hyperdynamic LV function, mild to moderate AI  . Hemorrhoids   . Hiatal hernia   . History of chest pain   . History of stroke    Right-sided hemiparesis   . Thrombocytopenia ([place])   . Tubular adenoma     Patient Active Problem List   Diagnosis Date Noted  . Suicidal thoughts   . Alcohol dependence ([place]) 08/26/2012    Class: Chronic  . Alcohol withdrawal ([place]) 08/26/2012    Class: Acute  . Abdominal pain, unspecified site 07/22/2012  . Hematochezia  07/22/2012  . GERD (gastroesophageal reflux disease) 07/22/2012  . Stroke ([place]) 08/20/2011  . Major depressive disorder, recurrent episode 08/20/2011  . Chest pain   . Hypertension   . Alcohol abuse   . Tobacco abuse   . Thrombocytopenia ([place])     Past Surgical History:  Procedure Laterality Date  . COLONOSCOPY WITH ESOPHAGOGASTRODUODENOSCOPY (EGD) N/A 07/30/2012   TML:YYTKP hiatal hernia/Multiple rectal and colonic polyps/Internal hemorrhoids. Pancolonic diverticulosis- tubular adenoma       Home Medications    Prior to Admission medications   Medication Sig Start Date End Date Taking? Authorizing Provider  albuterol (PROVENTIL HFA;VENTOLIN HFA) 108 (90 BASE) MCG/ACT inhaler Inhale 2 puffs into the lungs every 4 (four) hours as needed for shortness of breath.    Yes [provider]  amLODipine (NORVASC) 10 MG tablet Take 1 tablet (10 mg total) by mouth daily. For hypertension 08/28/12  Yes Lindell Spar I, NP  aspirin 325 MG tablet Take 1 tablet (325 mg total) by mouth daily. Blood thinner for heart health 08/28/12  Yes Lindell Spar I, NP  atenolol (TENORMIN) 50 MG tablet Take 1 tablet (50 mg total) by mouth daily. For CAD, Hypertension 08/28/12  Yes Lindell Spar I, NP  cloNIDine (CATAPRES) 0.1 MG tablet Take 1 tablet (0.1 mg total) by mouth 2 (two)  times daily. For hypertension 08/28/12  Yes Nwoko, Herbert Pun I, NP  diphenhydrAMINE (BENADRYL) spray Apply topically every 4 (four) hours as needed for itching.   Yes [provider]    Family History Family History  Problem Relation Age of Onset  . Coronary artery disease Mother 98       CABG  . Coronary artery disease Father 22       CABG  . Colon cancer Father 38    Social History Social History  Substance Use Topics  . Smoking status: Current Every Day Smoker    Packs/day: 0.50    Years: 40.00    Types: Cigarettes  . Smokeless tobacco: Never Used  . Alcohol use 3.0 oz/week    5 Cans of beer per week      Comment: 5 cans beer daily     Allergies   Patient has no known allergies.   Review of Systems Review of Systems  Constitutional: Negative for activity change.       All ROS Neg except as noted in HPI  HENT: Negative for nosebleeds.   Eyes: Negative for photophobia and discharge.  Respiratory: Positive for shortness of breath. Negative for cough and wheezing.   Cardiovascular: Positive for leg swelling. Negative for chest pain and palpitations.  Gastrointestinal: Positive for abdominal distention. Negative for abdominal pain and blood in stool.  Genitourinary: Negative for dysuria, frequency and hematuria.  Musculoskeletal: Positive for back pain and myalgias. Negative for arthralgias and neck pain.  Skin: Negative.   Neurological: Negative for dizziness, seizures and speech difficulty.  Psychiatric/Behavioral: Negative for confusion and hallucinations.     Physical Exam Updated Vital Signs BP 134/80   Pulse (!) 110   Temp 98.2 F (36.8 C) (Oral)   Resp 17   Ht 5\' 6"  (1.676 m)   Wt 86.2 kg (190 lb)   SpO2 90%   BMI 30.67 kg/m   Physical Exam  Constitutional: Vital signs are normal. He appears well-developed and well-nourished. He is active.  HENT:  Head: Normocephalic and atraumatic.  Right Ear: Tympanic membrane, external ear and ear canal normal.  Left Ear: Tympanic membrane, external ear and ear canal normal.  Nose: Nose normal.  Mouth/Throat: Uvula is midline, oropharynx is clear and moist and mucous membranes are normal.  Eyes: EOM and lids are normal. Pupils are equal, round, and reactive to light. Scleral icterus is present.  Neck: Trachea normal, normal range of motion and phonation normal. Neck supple. Carotid bruit is not present.  Cardiovascular: Regular rhythm and normal pulses.  Tachycardia present.   Pulmonary/Chest: He has wheezes.  Bilateral rhonchi present. Patient speaks in complete sentences without problem.  Abdominal: Soft. Normal appearance  and bowel sounds are normal. He exhibits distension. There is tenderness.  There is significant ascites diffusely of the abdomen.  Genitourinary: Rectal exam shows guaiac positive stool.  Genitourinary Comments: Rectal exam. Patient has a hemorrhoid present. No active bleeding from the hemorrhoid. Good Spink her tone is noted. There is no blood on my gloved finger. Stool for occult blood is positive.  Musculoskeletal: He exhibits edema.  There is increased redness of the palms. There is one to 2+ edema of the lower extremities.  Lymphadenopathy:       Head (right side): No submental, no preauricular and no posterior auricular adenopathy present.       Head (left side): No submental, no preauricular and no posterior auricular adenopathy present.    He has no cervical adenopathy.  Neurological: He is alert. He has normal strength. No cranial nerve deficit or sensory deficit. GCS eye subscore is 4. GCS verbal subscore is 5. GCS motor subscore is 6.  Skin: Skin is warm and dry.  Multiple scratched areas.  Psychiatric: His speech is normal.     ED Treatments / Results  Labs (all labs ordered are listed, but only abnormal results are displayed) Labs Reviewed  COMPREHENSIVE METABOLIC PANEL - Abnormal; Notable for the following:       Result Value   Sodium 127 (*)    Chloride 99 (*)    Glucose, Bld 120 (*)    BUN 27 (*)    Calcium 7.9 (*)    Albumin 2.0 (*)    AST 87 (*)    Total Bilirubin 7.1 (*)    All other components within normal limits  LIPASE, BLOOD - Abnormal; Notable for the following:    Lipase 53 (*)    All other components within normal limits  BRAIN NATRIURETIC PEPTIDE - Abnormal; Notable for the following:    B Natriuretic Peptide 125.0 (*)    All other components within normal limits  CBC WITH DIFFERENTIAL/PLATELET - Abnormal; Notable for the following:    RBC 2.40 (*)    Hemoglobin 8.3 (*)    HCT 23.9 (*)    MCH 34.6 (*)    RDW 16.1 (*)    Platelets 95 (*)    All  other components within normal limits  TROPONIN I  URINALYSIS, ROUTINE W REFLEX MICROSCOPIC  POC OCCULT BLOOD, ED    EKG  EKG Interpretation None       Radiology Dg Chest Portable 1 View  Result Date: 12/20/2016 CLINICAL DATA:  Shortness of breath . EXAM: PORTABLE CHEST 1 VIEW COMPARISON:  05/15/2015 . FINDINGS: Heart size normal. Low lung volumes with mild basilar atelectasis. No pleural effusion or pneumothorax. Thoracic spine scoliosis . IMPRESSION: Low lung volumes with mild basilar atelectasis. Electronically Signed   By: Marcello Moores  Register   On: 12/05/2016 10:24    Procedures Procedures (including critical care time)  Medications Ordered in ED Medications  ipratropium-albuterol (DUONEB) 0.5-2.5 (3) MG/3ML nebulizer solution 3 mL (3 mLs Nebulization Given 12/20/2016 1024)  morphine 2 MG/ML injection 2 mg (2 mg Intravenous Given 12/27/2016 1037)  ondansetron (ZOFRAN) injection 4 mg (4 mg Intravenous Given 12/28/2016 1037)     Initial Impression / Assessment and Plan / ED Course  I have reviewed the triage vital signs and the nursing notes.  Pertinent labs & imaging results that were available during my care of the patient were reviewed by me and considered in my medical decision making (see chart for details).       Final Clinical Impressions(s) / ED Diagnoses MDM Patient seen with me by Dr. Venora Maples Patient has a tachycardia present of 117 bpm . The remainder of the vital signs within normal limits. The pulse oximetry is between 90 and 95% on room air.  The lipase is elevated at 53. The B nature reticulocyte peptide is elevated at 125. Troponin is negative for acute event. The electrocardiogram shows a tachycardia, but no acute event noted. The competence of metabolic panel shows the sodium to be 127, the chloride to be 99, the glucose is elevated at 120, the BUN is 27 and elevated. The albumin is low at 2.0. The total bilirubin is elevated at 7.1. This is higher than it has been in  previous emergency department visits.  Chest x-ray is negative for  pneumonia or congestive failure.  CT scan of the abdomen was obtained due to the changes in liver function on. As well as changes in the lipase.  CT scan of the abdomen shows cirrhosis and portal hypertension with large portosystemic collateral vessels. There is noted large gastric varices measuring up to 2.3 cm in diameter. There is a large volume of ascites present. Diffuse body wall edema present. There is colonic diverticulosis noted. There is also noted aortic atherosclerosis, particularly with involvement of the left main into other coronary arteries.  Stool is positive for occult blood. Will discuss case with hospitalist for admission.  Case discussed. Patient will be admitted to the hospital.    Final diagnoses:  Ascites due to alcoholic cirrhosis ([place])  Esophageal varices without bleeding, unspecified esophageal varices type [facility])    New Prescriptions New Prescriptions   No medications on file     Lily Kocher, Hershal Coria 12/31/2016 1648    Lily Kocher, PA-C 12/25/2016 1713    Jola Schmidt, MD 12/08/16 [phone]

## 2016-12-07 NOTE — ED Notes (Signed)
Transported to CT 

## 2016-12-07 NOTE — ED Triage Notes (Addendum)
Pt was brought in by EMS due to shortness of breath. Denies cough. Pt states he hurts everywhere. Pt reports that he is swollen everywhere and his back hurts. Abdomen distended and firm, lower extremities edematous

## 2016-12-07 NOTE — ED Notes (Signed)
Attempted to call report, transferred to nurses phone with no answer  

## 2016-12-07 NOTE — H&P (Signed)
History and Physical  Jesse Stafford LNL:[id number] DOB: 01/29/1956 DOA: 12/26/2016  Referring physician: Lily Kocher, ED provider PCP: Raiford Simmonds., PA-C  Outpatient Specialists: none  Patient Coming From: home  Chief Complaint: SOB  HPI: Jesse Stafford is a 61 y.o. male with a history of alcohol abuse, depression, essential hypertension, hemorrhoids, thrombocytopenia. Patient presents with 6 weeks of worsening shortness of breath, dyspnea exertion, and orthopnea. He presented to his primary care physician, who, per the patient, did not appear to assess his shortness of breath. His breathing has continued to worsen where he is only able to ambulate a few steps. Activity provokes his breathing, rest improves it. He's had some dizziness, lightheadedness, particularly with walking. He denies melena or rectal bleeding.  Emergency Department Course: Hemoccult positive, although no frank blood in stool. Hemoglobin was 8.3, which is decreased from 16.5 on December 2016. Chest x-ray did not show any frank pulmonary edema, however his B natruretic peptide was elevated to 125. Bilirubin elevated to 7.1 with slightly increased BUN. Lipase also slightly increased.  Review of Systems:   Patient complains of back pain  Pt denies any fevers, chills, nausea, vomiting, diarrhea, constipation, abdominal pain, shortness of breath, dyspnea on exertion, orthopnea, cough, wheezing, palpitations, headache, vision changes, lightheadedness, dizziness, melena, rectal bleeding.  Review of systems are otherwise negative  Past Medical History:  Diagnosis Date  . Alcohol abuse   . Anxiety   . Chronic sinusitis   . Depression   . Diverticulosis   . Essential hypertension    Echo in 2011-mild LVH, hyperdynamic LV function, mild to moderate AI  . Hemorrhoids   . Hiatal hernia   . History of chest pain   . History of stroke    Right-sided hemiparesis   . Thrombocytopenia ([place])   . Tubular adenoma      Past Surgical History:  Procedure Laterality Date  . COLONOSCOPY WITH ESOPHAGOGASTRODUODENOSCOPY (EGD) N/A 07/30/2012   EYC:XKGYJ hiatal hernia/Multiple rectal and colonic polyps/Internal hemorrhoids. Pancolonic diverticulosis- tubular adenoma   Social History:  reports that he has been smoking Cigarettes.  He has a 20.00 pack-year smoking history. He has never used smokeless tobacco. He reports that he drinks about 3.0 oz of alcohol per week . He reports that he does not use drugs. Patient lives at home  No Known Allergies  Family History  Problem Relation Age of Onset  . Coronary artery disease Mother 30       CABG  . Coronary artery disease Father 55       CABG  . Colon cancer Father 52     Prior to Admission medications   Medication Sig Start Date End Date Taking? Authorizing Provider  albuterol (PROVENTIL HFA;VENTOLIN HFA) 108 (90 BASE) MCG/ACT inhaler Inhale 2 puffs into the lungs every 4 (four) hours as needed for shortness of breath.    Yes [provider]  amLODipine (NORVASC) 10 MG tablet Take 1 tablet (10 mg total) by mouth daily. For hypertension 08/28/12  Yes Lindell Spar I, NP  aspirin 325 MG tablet Take 1 tablet (325 mg total) by mouth daily. Blood thinner for heart health 08/28/12  Yes Lindell Spar I, NP  atenolol (TENORMIN) 50 MG tablet Take 1 tablet (50 mg total) by mouth daily. For CAD, Hypertension 08/28/12  Yes Lindell Spar I, NP  cloNIDine (CATAPRES) 0.1 MG tablet Take 1 tablet (0.1 mg total) by mouth 2 (two) times daily. For hypertension 08/28/12  Yes Encarnacion Slates, NP  diphenhydrAMINE (BENADRYL) spray Apply topically every 4 (four) hours as needed for itching.   Yes [provider]    Physical Exam: BP (!) 144/84 (BP Location: Right Arm)   Pulse (!) 109   Temp 98.2 F (36.8 C) (Oral)   Resp 20   Ht 5\' 6"  (1.676 m)   Wt 86.2 kg (190 lb)   SpO2 91%   BMI 30.67 kg/m   General: Middle-aged male who appears older than stated age.. Awake  and alert and oriented x3. No acute cardiopulmonary distress.  HEENT: Normocephalic atraumatic.  Right and left ears normal in appearance.  Pupils equal, round, reactive to light. Extraocular muscles are intact. Sclerae icteric and noninjected.  Moist mucosal membranes. No mucosal lesions.  Neck: Neck supple without lymphadenopathy. No carotid bruits. No masses palpated.  Cardiovascular: Regular rate with normal S1-S2 sounds. No murmurs, rubs, gallops auscultated. Increased JVD to 8-9 cm. 3+ pitting edema to lower abdomen Respiratory: Good respiratory effort with no wheezes, rales, rhonchi. Lungs clear to auscultation bilaterally.  No accessory muscle use. Abdomen: Extremely edematous and distended. Hypertympanitic. Tender to palpation diffusely. Unable to appreciate liver margin. Skin: Venous stasis changes to lower extremities bilaterally.  Dry, warm to touch. 2+ dorsalis pedis and radial pulses. Musculoskeletal: Tenderness in legs bilaterally due to edema. All major joints not erythematous nontender.  No upper or lower joint deformation.  Good ROM.  No contractures  Psychiatric: Intact judgment and insight. Pleasant and cooperative. Neurologic: No focal neurological deficits. Strength is 5/5 and symmetric in upper and lower extremities.  Cranial nerves II through XII are grossly intact.           Labs on Admission: I have personally reviewed following labs and imaging studies  CBC:  Recent Labs Lab 12/16/2016 1018  WBC 8.8  NEUTROABS 7.3  HGB 8.3*  HCT 23.9*  MCV 99.6  PLT 95*   Basic Metabolic Panel:  Recent Labs Lab 12/09/2016 1018  NA 127*  K 4.4  CL 99*  CO2 22  GLUCOSE 120*  BUN 27*  CREATININE 0.88  CALCIUM 7.9*   GFR: Estimated Creatinine Clearance: 93.1 mL/min (by C-G formula based on SCr of 0.88 mg/dL). Liver Function Tests:  Recent Labs Lab 12/21/2016 1018  AST 87*  ALT 36  ALKPHOS 78  BILITOT 7.1*  PROT 6.7  ALBUMIN 2.0*    Recent Labs Lab  12/18/2016 1018  LIPASE 53*   No results for input(s): AMMONIA in the last 168 hours. Coagulation Profile: No results for input(s): INR, PROTIME in the last 168 hours. Cardiac Enzymes:  Recent Labs Lab 12/28/2016 1018  TROPONINI <0.03   BNP (last 3 results) No results for input(s): PROBNP in the last 8760 hours. HbA1C: No results for input(s): HGBA1C in the last 72 hours. CBG: No results for input(s): GLUCAP in the last 168 hours. Lipid Profile: No results for input(s): CHOL, HDL, LDLCALC, TRIG, CHOLHDL, LDLDIRECT in the last 72 hours. Thyroid Function Tests: No results for input(s): TSH, T4TOTAL, FREET4, T3FREE, THYROIDAB in the last 72 hours. Anemia Panel: No results for input(s): VITAMINB12, FOLATE, FERRITIN, TIBC, IRON, RETICCTPCT in the last 72 hours. Urine analysis:    Component Value Date/Time   COLORURINE YELLOW 05/15/2015 2252   APPEARANCEUR CLEAR 05/15/2015 2252   LABSPEC <1.005 (L) 05/15/2015 2252   PHURINE 5.5 05/15/2015 2252   GLUCOSEU NEGATIVE 05/15/2015 2252   HGBUR TRACE (A) 05/15/2015 2252   BILIRUBINUR NEGATIVE 05/15/2015 2252   KETONESUR NEGATIVE 05/15/2015 2252  PROTEINUR NEGATIVE 05/15/2015 2252   UROBILINOGEN 0.2 08/17/2012 1014   NITRITE NEGATIVE 05/15/2015 2252   LEUKOCYTESUR NEGATIVE 05/15/2015 2252   Sepsis Labs: @LABRCNTIP (procalcitonin:4,lacticidven:4) )No results found for this or any previous visit (from the past 240 hour(s)).   Radiological Exams on Admission: Ct Chest W Contrast  Result Date: 12/10/2016 CLINICAL DATA:  61 year old male with history of shortness of breath. Diffuse swelling. Distended abdomen. Jaundice. Elevated LFTs. Ascites. EXAM: CT CHEST, ABDOMEN, AND PELVIS WITH CONTRAST TECHNIQUE: Multidetector CT imaging of the chest, abdomen and pelvis was performed following the standard protocol during bolus administration of intravenous contrast. CONTRAST:  154mL ISOVUE-300 IOPAMIDOL (ISOVUE-300) INJECTION 61% COMPARISON:  No  priors. FINDINGS: CT CHEST FINDINGS Cardiovascular: Heart size is normal. There is no significant pericardial fluid, thickening or pericardial calcification. There is aortic atherosclerosis, as well as atherosclerosis of the great vessels of the mediastinum and the coronary arteries, including calcified atherosclerotic plaque in the left main, left anterior descending and right coronary arteries. Calcifications of the aortic valve. Mediastinum/Nodes: No pathologically enlarged mediastinal or hilar lymph nodes. Esophagus is unremarkable in appearance. No axillary lymphadenopathy. Lungs/Pleura: No suspicious appearing pulmonary nodules or masses. No acute consolidative airspace disease. No pleural effusions. Areas of bibasilar subsegmental atelectasis. Musculoskeletal: There are no aggressive appearing lytic or blastic lesions noted in the visualized portions of the skeleton. CT ABDOMEN PELVIS FINDINGS Hepatobiliary: Liver has a shrunken appearance and nodular contour, compatible with underlying cirrhosis. No discrete cystic or solid hepatic lesions are identified. No intra or extrahepatic biliary ductal dilatation. Gallbladder appears moderately distended. No definite calcified gallstones are noted. Pancreas: No pancreatic mass. No pancreatic ductal dilatation. No well-defined peripancreatic fluid collections. Spleen: Unremarkable. Adrenals/Urinary Tract: Bilateral kidneys and bilateral adrenal glands are normal in appearance. No hydroureteronephrosis. Urinary bladder is normal in appearance. Stomach/Bowel: The appearance of the stomach is normal. There is no pathologic dilatation of small bowel or colon. Numerous colonic diverticulae are noted. Unfortunately, secondary to the large amount of ascites, accurate assessment for surrounding inflammatory changes is not possible on today's examination. Normal appendix. Vascular/Lymphatic: Aortic atherosclerosis, without evidence of aneurysm or dissection in the abdominal  or pelvic vasculature. Numerous portosystemic collateral pathways are noted, most notable for large gastric varices which communicate to the left renal vein. The largest of these gastric varices is adjacent to the cardia of the stomach measuring up to 2.3 cm in diameter. No lymphadenopathy noted in the abdomen or pelvis. Reproductive: Prostate gland seminal vesicles are unremarkable in appearance. Other: Large volume of ascites.  No pneumoperitoneum. Musculoskeletal: Diffuse body wall edema. There are no aggressive appearing lytic or blastic lesions noted in the visualized portions of the skeleton. IMPRESSION: 1. 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 2. Large volume of ascites.  Diffuse body wall edema. 3. No acute findings in the thorax. 4. Colonic diverticulosis. 5. Aortic atherosclerosis, in addition to left main and 2 vessel coronary artery disease. Please note that although the presence of coronary artery calcium documents the presence of coronary artery disease, the severity of this disease and any potential stenosis cannot be assessed on this non-gated CT examination. Assessment for potential risk factor modification, dietary therapy or pharmacologic therapy may be warranted, if clinically indicated. 6. There are calcifications of the aortic valve. Echocardiographic correlation for evaluation of potential valvular dysfunction may be warranted if clinically indicated. 7. Additional incidental findings, as above. Aortic Atherosclerosis (ICD10-I70.0). Electronically  Signed   By: Vinnie Langton M.D.   On: 12/27/2016 15:27   Ct Abdomen Pelvis W Contrast  Result Date: 12/18/2016 CLINICAL DATA:  61 year old male with history of shortness of breath. Diffuse swelling. Distended abdomen. Jaundice. Elevated LFTs. Ascites. EXAM: CT CHEST, ABDOMEN, AND PELVIS WITH CONTRAST TECHNIQUE:  Multidetector CT imaging of the chest, abdomen and pelvis was performed following the standard protocol during bolus administration of intravenous contrast. CONTRAST:  125mL ISOVUE-300 IOPAMIDOL (ISOVUE-300) INJECTION 61% COMPARISON:  No priors. FINDINGS: CT CHEST FINDINGS Cardiovascular: Heart size is normal. There is no significant pericardial fluid, thickening or pericardial calcification. There is aortic atherosclerosis, as well as atherosclerosis of the great vessels of the mediastinum and the coronary arteries, including calcified atherosclerotic plaque in the left main, left anterior descending and right coronary arteries. Calcifications of the aortic valve. Mediastinum/Nodes: No pathologically enlarged mediastinal or hilar lymph nodes. Esophagus is unremarkable in appearance. No axillary lymphadenopathy. Lungs/Pleura: No suspicious appearing pulmonary nodules or masses. No acute consolidative airspace disease. No pleural effusions. Areas of bibasilar subsegmental atelectasis. Musculoskeletal: There are no aggressive appearing lytic or blastic lesions noted in the visualized portions of the skeleton. CT ABDOMEN PELVIS FINDINGS Hepatobiliary: Liver has a shrunken appearance and nodular contour, compatible with underlying cirrhosis. No discrete cystic or solid hepatic lesions are identified. No intra or extrahepatic biliary ductal dilatation. Gallbladder appears moderately distended. No definite calcified gallstones are noted. Pancreas: No pancreatic mass. No pancreatic ductal dilatation. No well-defined peripancreatic fluid collections. Spleen: Unremarkable. Adrenals/Urinary Tract: Bilateral kidneys and bilateral adrenal glands are normal in appearance. No hydroureteronephrosis. Urinary bladder is normal in appearance. Stomach/Bowel: The appearance of the stomach is normal. There is no pathologic dilatation of small bowel or colon. Numerous colonic diverticulae are noted. Unfortunately, secondary to the large  amount of ascites, accurate assessment for surrounding inflammatory changes is not possible on today's examination. Normal appendix. Vascular/Lymphatic: Aortic atherosclerosis, without evidence of aneurysm or dissection in the abdominal or pelvic vasculature. Numerous portosystemic collateral pathways are noted, most notable for large gastric varices which communicate to the left renal vein. The largest of these gastric varices is adjacent to the cardia of the stomach measuring up to 2.3 cm in diameter. No lymphadenopathy noted in the abdomen or pelvis. Reproductive: Prostate gland seminal vesicles are unremarkable in appearance. Other: Large volume of ascites.  No pneumoperitoneum. Musculoskeletal: Diffuse body wall edema. There are no aggressive appearing lytic or blastic lesions noted in the visualized portions of the skeleton. IMPRESSION: 1. 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 2. Large volume of ascites.  Diffuse body wall edema. 3. No acute findings in the thorax. 4. Colonic diverticulosis. 5. Aortic atherosclerosis, in addition to left main and 2 vessel coronary artery disease. Please note that although the presence of coronary artery calcium documents the presence of coronary artery disease, the severity of this disease and any potential stenosis cannot be assessed on this non-gated CT examination. Assessment for potential risk factor modification, dietary therapy or pharmacologic therapy may be warranted, if clinically indicated. 6. There are calcifications of the aortic valve. Echocardiographic correlation for evaluation of potential valvular dysfunction may be warranted if clinically indicated. 7. Additional incidental findings, as above. Aortic Atherosclerosis (ICD10-I70.0). Electronically Signed   By: Vinnie Langton M.D.   On: 12/28/2016 15:27   Dg Chest Portable 1  View  Result Date: 12/25/2016 CLINICAL DATA:  Shortness of breath .  EXAM: PORTABLE CHEST 1 VIEW COMPARISON:  05/15/2015 . FINDINGS: Heart size normal. Low lung volumes with mild basilar atelectasis. No pleural effusion or pneumothorax. Thoracic spine scoliosis . IMPRESSION: Low lung volumes with mild basilar atelectasis. Electronically Signed   By: Marcello Moores  Register   On: 12/26/2016 10:24    EKG: Independently reviewed. Sinus tachycardia with low voltage precordial leads. No acute ST changes.  Assessment/Plan: Principal Problem:   Acute heart failure (HCC) Active Problems:   Hypertension   Thrombocytopenia (HCC)   GERD (gastroesophageal reflux disease)   Alcohol dependence (HCC)   Iron deficiency anemia due to chronic blood loss   Heme positive stool   Alcoholic cirrhosis of liver with ascites ([place])   Hyperbilirubinemia    This patient was discussed with the ED physician, including pertinent vitals, physical exam findings, labs, and imaging.  We also discussed care given by the ED provider.  #1 acute heart failure  Pt tachycardic - ? HO failure secondary to anemia.  Telemetry monitoring  Strict I/O  Daily Weights  Diuresis: Lasix  Potassium: 20 mEq twice a day by mouth  Echo cardiac exam tomorrow  Repeat BMP tomorrow  At on lisinopril #2 anemia  With patient's heart failure, will transfuse 2 units  Add on anemia panel #3 heme positive stool  Protonix  GI consult #4 hyperbilirubinemia  Add on direct and indirect bilirubin #5 alcohol dependence  Withdrawal protocol #6 GERD #7 Thrombocytopenia  We'll hold anticoagulation #8 alcohol cirrhosis with ascites  GI consult #9 hypertension  Continue atenolol and add lisinopril instead of amlodipine  DVT prophylaxis: SCDs Consultants: GI Code Status: Full code Family Communication: None  Disposition Plan: Admission   Truett Mainland, DO [facility] Pager [phone]  If 7PM-7AM, please contact  night-coverage [email or web address] Password [email or web address]

## 2016-12-08 ENCOUNTER — Inpatient Hospital Stay (HOSPITAL_COMMUNITY): Payer: Medicaid Other

## 2016-12-08 ENCOUNTER — Encounter (HOSPITAL_COMMUNITY): Payer: Self-pay | Admitting: Gastroenterology

## 2016-12-08 DIAGNOSIS — R195 Other fecal abnormalities: Secondary | ICD-10-CM

## 2016-12-08 DIAGNOSIS — D696 Thrombocytopenia, unspecified: Secondary | ICD-10-CM

## 2016-12-08 DIAGNOSIS — K7011 Alcoholic hepatitis with ascites: Secondary | ICD-10-CM

## 2016-12-08 DIAGNOSIS — I700 Atherosclerosis of aorta: Secondary | ICD-10-CM

## 2016-12-08 DIAGNOSIS — I2510 Atherosclerotic heart disease of native coronary artery without angina pectoris: Secondary | ICD-10-CM

## 2016-12-08 DIAGNOSIS — K7031 Alcoholic cirrhosis of liver with ascites: Principal | ICD-10-CM

## 2016-12-08 LAB — ECHOCARDIOGRAM COMPLETE
AVLVOTPG: 10 mmHg
CHL CUP MV DEC (S): 327
CHL CUP STROKE VOLUME: 58 mL
CHL CUP TV REG PEAK VELOCITY: 256 cm/s
E/e' ratio: 10.55
EWDT: 327 ms
FS: 50 % — AB (ref 28–44)
HEIGHTINCHES: 66 in
IVS/LV PW RATIO, ED: 1.05
LA diam end sys: 34 mm
LA diam index: 1.69 cm/m2
LA vol A4C: 49.7 ml
LA vol: 59.5 mL
LASIZE: 34 mm
LAVOLIN: 29.6 mL/m2
LDCA: 2.27 cm2
LV E/e' medial: 10.55
LV E/e'average: 10.55
LV TDI E'LATERAL: 9.46
LV dias vol index: 43 mL/m2
LV dias vol: 86 mL (ref 62–150)
LV e' LATERAL: 9.46 cm/s
LV sys vol index: 14 mL/m2
LV sys vol: 28 mL
LVOT SV: 89 mL
LVOT VTI: 39.3 cm
LVOT peak vel: 155 cm/s
LVOTD: 17 mm
Lateral S' vel: 15.7 cm/s
MV Peak grad: 4 mmHg
MV pk A vel: 66.4 m/s
MV pk E vel: 99.8 m/s
P 1/2 time: 525 ms
PW: 11.4 mm — AB (ref 0.6–1.1)
RV sys press: 29 mmHg
Simpson's disk: 67
TAPSE: 24.2 mm
TDI e' medial: 10.4
TR max vel: 256 cm/s
WEIGHTICAEL: 2982.4 [oz_av]

## 2016-12-08 LAB — CBC WITH DIFFERENTIAL/PLATELET
Basophils Absolute: 0 10*3/uL (ref 0.0–0.1)
Basophils Relative: 0 %
EOS ABS: 0.1 10*3/uL (ref 0.0–0.7)
EOS PCT: 1 %
HCT: 31.4 % — ABNORMAL LOW (ref 39.0–52.0)
Hemoglobin: 10.6 g/dL — ABNORMAL LOW (ref 13.0–17.0)
LYMPHS ABS: 0.6 10*3/uL — AB (ref 0.7–4.0)
Lymphocytes Relative: 6 %
MCH: 33 pg (ref 26.0–34.0)
MCHC: 33.8 g/dL (ref 30.0–36.0)
MCV: 97.8 fL (ref 78.0–100.0)
MONOS PCT: 9 %
Monocytes Absolute: 1 10*3/uL (ref 0.1–1.0)
Neutro Abs: 9.6 10*3/uL — ABNORMAL HIGH (ref 1.7–7.7)
Neutrophils Relative %: 85 %
PLATELETS: 89 10*3/uL — AB (ref 150–400)
RBC: 3.21 MIL/uL — AB (ref 4.22–5.81)
RDW: 17 % — ABNORMAL HIGH (ref 11.5–15.5)
WBC: 11.3 10*3/uL — AB (ref 4.0–10.5)

## 2016-12-08 LAB — COMPREHENSIVE METABOLIC PANEL
ALT: 37 U/L (ref 17–63)
AST: 66 U/L — ABNORMAL HIGH (ref 15–41)
Albumin: 2 g/dL — ABNORMAL LOW (ref 3.5–5.0)
Alkaline Phosphatase: 74 U/L (ref 38–126)
Anion gap: 5 (ref 5–15)
BUN: 30 mg/dL — ABNORMAL HIGH (ref 6–20)
CHLORIDE: 102 mmol/L (ref 101–111)
CO2: 26 mmol/L (ref 22–32)
CREATININE: 0.95 mg/dL (ref 0.61–1.24)
Calcium: 8.1 mg/dL — ABNORMAL LOW (ref 8.9–10.3)
GFR calc non Af Amer: >60 mL/min (ref >60)
Glucose, Bld: 107 mg/dL — ABNORMAL HIGH (ref 65–99)
Potassium: 4.6 mmol/L (ref 3.5–5.1)
SODIUM: 133 mmol/L — AB (ref 135–145)
Total Bilirubin: 11.4 mg/dL — ABNORMAL HIGH (ref 0.3–1.2)
Total Protein: 6.9 g/dL (ref 6.5–8.1)

## 2016-12-08 LAB — LIPASE, BLOOD: LIPASE: 45 U/L (ref 11–51)

## 2016-12-08 LAB — BILIRUBIN, DIRECT: Bilirubin, Direct: 4.9 mg/dL — ABNORMAL HIGH (ref 0.1–0.5)

## 2016-12-08 LAB — PROTIME-INR
INR: 2.3
PROTHROMBIN TIME: 25.7 s — AB (ref 11.4–15.2)

## 2016-12-08 LAB — AMMONIA: AMMONIA: 53 umol/L — AB (ref 9–35)

## 2016-12-08 MED ORDER — ONDANSETRON HCL 4 MG PO TABS: 4.0000 mg | ORAL_TABLET | Freq: Three times a day (TID) | ORAL | Status: DC | PRN

## 2016-12-08 MED ORDER — LACTULOSE 10 GM/15ML PO SOLN
20.0000 g | Freq: Every day | ORAL | Status: DC
  Administered 2016-12-08: 20 g via ORAL
  Filled 2016-12-08 (×2): qty 30

## 2016-12-08 MED ORDER — FUROSEMIDE 10 MG/ML IJ SOLN
20.0000 mg | Freq: Once | INTRAMUSCULAR | Status: AC
  Administered 2016-12-08: 20 mg via INTRAVENOUS
  Filled 2016-12-08: qty 2

## 2016-12-08 NOTE — Progress Notes (Signed)
PROGRESS NOTE  Jesse Stafford BSW:[id number] DOB: [birth year] DOA: 01/01/2017 PCP: Royce Macadamia D., PA-C  Brief Narrative: 61 year old man PMH ongoing alcohol use, hypertension presented with 6 week history of shortness of breath, dyspnea on exertion and orthopnea. Admitted for decompensated alcoholic cirrhosis, acute alcoholic hepatitis.  Assessment/Plan #1: Decompensated alcoholic cirrhosis with associated portal hypertension, gastric varices, hyponatremia, thrombocytopenia, large volume ascites and massive volume overload -IV diuresis for massive volume overload -Paracentesis 7/9 with fluid analysis -GI plans colonoscopy and EGD in the near future  #2: Acute alcoholic hepatitis -discriminant function 59.7 -Defer initiation of prednisolone therapy to GI  #3: Alcohol dependence versus abuse -Monitor for withdrawal -Recommended strict abstinence  Aortic atherosclerosis, two-vessel coronary artery disease by CT imaging. Asymptomatic. Follow-up as an outpatient.  DVT prophylaxis: SCDs Code Status: full Family Communication: none Disposition Plan: home    Murray Hodgkins, MD  [facility] Direct contact: [phone] --[place] [place]  --[email or web address]; password [email or web address]  7PM-7AM contact night coverage as above 12/08/2016, 12:51 PM  LOS: 1 day   Consultants:  Gastroenterology   Procedures:  7/5 transfusion 2 units packed red blood cells  Antimicrobials:    Interval history/Subjective: Short of breath, feels poorly.  Objective: Vitals: Afebrile, 97.6, 18, 67, 144/73, 100% on 2 L  Exam:     Constitutional: Appears chronically ill but nontoxic. Appears calm, moderately uncomfortable.  Eyes: Pupils, irises, lids appear unremarkable.  ENT: Mildly hard of hearing. Lips and tongue appear normal.  Respiratory: Clear to auscultation bilaterally. No wheezes, rales or rhonchi. Normal respiratory effort.   Cardiovascular: Regular rate and rhythm, no murmur, rub  or gallop. 2+ bilateral lower extremity edema.  Abdomen: Soft, nontender, large volume ascites  Psychiatric: Grossly normal mood and affect. Speech fluent and appropriate.   I have personally reviewed the following:  Urine output reported as 200 Labs:  Total bilirubin 11.4, AST 66, lipase 45  Anemia panel noted, vitamin B12 893  Hemoglobin up to 10.6 status post transfusion  Platelet count 89, no significant change  Imaging studies:  Chest x-ray atelectasis seen  CT abdomen and pelvis, chest: Stigmata of cirrhosis and portal hypertension, large volume ascites, aortic atherosclerosis, two-vessel coronary artery disease  Medical tests:  EKG sinus tachycardia, no acute changes   Test discussed with performing physician:    Decision to obtain old records:    Review and summation of old records:  08/2011 echocardiogram: LVEF 60-65 percent with normal wall motion.  Scheduled Meds: . atenolol  50 mg Oral Daily  . cloNIDine  0.1 mg Oral BID  . folic acid  1 mg Oral Daily  . furosemide  40 mg Intravenous BID  . lisinopril  10 mg Oral Daily  . LORazepam  0-4 mg Intravenous Q6H   Followed by  . [START ON 12/09/2016] LORazepam  0-4 mg Intravenous Q12H  . multivitamin with minerals  1 tablet Oral Daily  . pantoprazole (PROTONIX) IV  40 mg Intravenous Q12H  . sodium chloride flush  3 mL Intravenous Q12H  . thiamine  100 mg Oral Daily   Or  . thiamine  100 mg Intravenous Daily   Continuous Infusions: . sodium chloride      Principal Problem:   Alcoholic cirrhosis of liver with ascites (HCC) Active Problems:   Thrombocytopenia (HCC)   Alcohol dependence (HCC)   Heme positive stool   Hyperbilirubinemia   Alcoholic hepatitis with ascites   Aortic atherosclerosis (HCC)   CAD (coronary artery disease)   LOS: 1 day

## 2016-12-08 NOTE — Consult Note (Signed)
Referring Provider:  Dr. Nehemiah Settle  Primary Care Physician:  Raiford Simmonds., PA-C Primary Gastroenterologist:  Dr. Gala Romney   Date of Admission: 12/12/2016 Date of Consultation: 12/08/16  Reason for Consultation: Alcoholic cirrhosis, ascites, anemia with heme positive stool   HPI:  Jesse Stafford is a 61 y.o. year old male with a history of alcohol abuse, multiple adenomatous polyps with tubulovillous adenoma on last colonoscopy 2014 and overdue for surveillance, last EGD 2014, and presenting with shortness of breath, back pain, and weakness. CT abd/pelvis with contrast revealed stigmata of cirrhosis and portal hypertension with large portosystemic collateral vessels and notable for large gastric varices measuring up to 2.3 cm in diameter. Large volume of ascites with diffuse body wall edema, further findings as listed below. CT chest completed as well. Hyponatremia with sodium 127, AST 87, bilirubin 7.1. This morning, bilirubin increased to 11.4, AST now 66. Improvement in hyponatremia noted. Admitting Hgb 8.3, s/p 2 units PRBCs and now 10.6. Heme positive on admission without overt GI bleeding. Thrombocytopenia with platelets 89 this morning. No INR on file.   Patient states he came to the hospital due to worsening lower extremity edema and abdominal distension, shortness of breath. Continues to drink ETOH, not daily but "when I drink, I drink pretty heavy". Drinks beer. Unable to quantify. Last drink was Wednesday or Thursday night as he couldn't sleep. States he sometimes feels confused. Drinking makes him feel better, stating "it's the only thing I have for back pain". No melena. Scant hematochezia with wiping at times. No constipation/diarrhea. No history of IV drug use. No prior remote blood transfusions.    Past Medical History:  Diagnosis Date  . Alcohol abuse   . Anxiety   . Chronic sinusitis   . Depression   . Diverticulosis   . Essential hypertension    Echo in 2011-mild LVH,  hyperdynamic LV function, mild to moderate AI  . Hemorrhoids   . Hiatal hernia   . History of chest pain   . History of stroke    Right-sided hemiparesis   . Thrombocytopenia ([place])   . Tubular adenoma     Past Surgical History:  Procedure Laterality Date  . COLONOSCOPY WITH ESOPHAGOGASTRODUODENOSCOPY (EGD) N/A 07/30/2012   QQP:[id number] hiatal hernia/Multiple rectal and colonic polyps, path with tubulovillous adenomas and tubular adenomas, Internal hemorrhoids. EGD with chronic gastritis, friable pyloric channel with benign path on biopsy    Prior to Admission medications   Medication Sig Start Date End Date Taking? Authorizing Provider  albuterol (PROVENTIL HFA;VENTOLIN HFA) 108 (90 BASE) MCG/ACT inhaler Inhale 2 puffs into the lungs every 4 (four) hours as needed for shortness of breath.    Yes [provider]  amLODipine (NORVASC) 10 MG tablet Take 1 tablet (10 mg total) by mouth daily. For hypertension 08/28/12  Yes Lindell Spar I, NP  aspirin 325 MG tablet Take 1 tablet (325 mg total) by mouth daily. Blood thinner for heart health 08/28/12  Yes Lindell Spar I, NP  atenolol (TENORMIN) 50 MG tablet Take 1 tablet (50 mg total) by mouth daily. For CAD, Hypertension 08/28/12  Yes Lindell Spar I, NP  cloNIDine (CATAPRES) 0.1 MG tablet Take 1 tablet (0.1 mg total) by mouth 2 (two) times daily. For hypertension 08/28/12  Yes Nwoko, Herbert Pun I, NP  diphenhydrAMINE (BENADRYL) spray Apply topically every 4 (four) hours as needed for itching.   Yes [provider]    Current Facility-Administered Medications  Medication Dose Route Frequency Provider Last Rate Last  Dose  . 0.9 %  sodium chloride infusion  250 mL Intravenous PRN Truett Mainland, DO      . acetaminophen (TYLENOL) tablet 650 mg  650 mg Oral Q4H PRN Truett Mainland, DO      . albuterol (PROVENTIL) (2.5 MG/3ML) 0.083% nebulizer solution 3 mL  3 mL Inhalation Q4H PRN Truett Mainland, DO      . atenolol (TENORMIN) tablet 50  mg  50 mg Oral Daily Truett Mainland, DO   50 mg at 12/08/16 1884  . cloNIDine (CATAPRES) tablet 0.1 mg  0.1 mg Oral BID Truett Mainland, DO   0.1 mg at 12/08/16 1660  . folic acid (FOLVITE) tablet 1 mg  1 mg Oral Daily Truett Mainland, DO   1 mg at 12/08/16 6301  . furosemide (LASIX) injection 40 mg  40 mg Intravenous BID Truett Mainland, DO   40 mg at 12/08/16 6010  . HYDROcodone-acetaminophen (NORCO/VICODIN) 5-325 MG per tablet 1-2 tablet  1-2 tablet Oral Q6H PRN Truett Mainland, DO   2 tablet at 12/08/16 0906  . lisinopril (PRINIVIL,ZESTRIL) tablet 10 mg  10 mg Oral Daily Truett Mainland, DO   10 mg at 12/08/16 9323  . LORazepam (ATIVAN) injection 0-4 mg  0-4 mg Intravenous Q6H Truett Mainland, DO       Followed by  . [START ON 12/09/2016] LORazepam (ATIVAN) injection 0-4 mg  0-4 mg Intravenous Q12H Truett Mainland, DO      . LORazepam (ATIVAN) tablet 1 mg  1 mg Oral Q6H PRN Truett Mainland, DO       Or  . LORazepam (ATIVAN) injection 1 mg  1 mg Intravenous Q6H PRN Truett Mainland, DO      . multivitamin with minerals tablet 1 tablet  1 tablet Oral Daily Truett Mainland, DO   1 tablet at 12/08/16 5573  . ondansetron (ZOFRAN) injection 4 mg  4 mg Intravenous Q6H PRN Truett Mainland, DO      . pantoprazole (PROTONIX) injection 40 mg  40 mg Intravenous Q12H Truett Mainland, DO   40 mg at 12/08/16 0911  . potassium chloride SA (K-DUR,KLOR-CON) CR tablet 20 mEq  20 mEq Oral BID Truett Mainland, DO   20 mEq at 12/08/16 2202  . sodium chloride flush (NS) 0.9 % injection 3 mL  3 mL Intravenous Q12H Truett Mainland, DO   3 mL at 12/08/16 0905  . sodium chloride flush (NS) 0.9 % injection 3 mL  3 mL Intravenous PRN Truett Mainland, DO      . thiamine (VITAMIN B-1) tablet 100 mg  100 mg Oral Daily Truett Mainland, DO   100 mg at 12/08/16 5427   Or  . thiamine (B-1) injection 100 mg  100 mg Intravenous Daily Truett Mainland, DO   100 mg at 12/16/2016 1957    Allergies as of 12/15/2016   . (No Known Allergies)    Family History  Problem Relation Age of Onset  . Coronary artery disease Mother 60       CABG  . Coronary artery disease Father 75       CABG  . Colon cancer Father 54    Social History   Social History  . Marital status: Single    Spouse name: N/A  . Number of children: 0  . Years of education: N/A   Occupational History  . Disabled    Social  History Main Topics  . Smoking status: Current Every Day Smoker    Packs/day: 0.50    Years: 40.00    Types: Cigarettes  . Smokeless tobacco: Never Used  . Alcohol use 3.0 oz/week    5 Cans of beer per week     Comment: 5 cans beer daily  . Drug use: No  . Sexual activity: Not Currently   Other Topics Concern  . Not on file   Social History Narrative  . No narrative on file    Review of Systems: Gen: see HPI  CV: Denies chest pain, heart palpitations, syncope, edema  Resp: see HPI GI: see HPI  GU : Denies urinary burning, urinary frequency, urinary incontinence.  MS: see HPI  Derm: Denies rash, itching, dry skin Psych: see HPI  Heme: see HPI   Physical Exam: Vital signs in last 24 hours: Temp:  [97.6 F (36.4 C)-97.9 F (36.6 C)] 97.6 F (36.4 C) (07/07 0520) Pulse Rate:  [65-116] 65 (07/07 0900) Resp:  [10-20] 18 (07/07 0520) BP: (108-153)/(73-97) 121/75 (07/07 0900) SpO2:  [90 %-100 %] 100 % (07/07 0520) Weight:  [186 lb 6.4 oz (84.6 kg)] 186 lb 6.4 oz (84.6 kg) (07/06 2030) Last BM Date: 12/05/16 General:   Alert,  Jaundiced, appears chronically ill, flat affect  Head:  Normocephalic and atraumatic. Eyes:  +scleral icterus  Ears:  Normal auditory acuity. Nose:  No deformity, discharge,  or lesions. Mouth:  No deformity or lesions, dentition normal. Lungs:  Clear bilaterally, decreased bases  Heart:  S1 S2 present, no murmurs Abdomen:  Tense ascites, anasarca, unable to assess HSM due to large AP diameter Rectal:  Deferred until time of colonoscopy.   Msk:  Symmetrical  without gross deformities. Normal posture. Extremities:  With 2+ pitting pedal edema, pitting lower extremity edema extending to upper thigh, anasarca  Neurologic:  Alert and  oriented x4;  MILD ASTERIXIS ON EXAM Psych:  Alert and cooperative. Flat affect   Intake/Output from previous day: 07/06 0701 - 07/07 0700 In: 670 [Blood:670] Out: 200 [Urine:200] Intake/Output this shift: Total I/O In: 3 [I.V.:3] Out: -   Lab Results:  Recent Labs  12/09/2016 1018 12/08/16 0550  WBC 8.8 11.3*  HGB 8.3* 10.6*  HCT 23.9* 31.4*  PLT 95* 89*   BMET  Recent Labs  12/03/2016 1018 12/08/16 0550  NA 127* 133*  K 4.4 4.6  CL 99* 102  CO2 22 26  GLUCOSE 120* 107*  BUN 27* 30*  CREATININE 0.88 0.95  CALCIUM 7.9* 8.1*   LFT  Recent Labs  12/15/2016 1018 12/08/16 0550  PROT 6.7 6.9  ALBUMIN 2.0* 2.0*  AST 87* 66*  ALT 36 37  ALKPHOS 78 74  BILITOT 7.1* 11.4*  BILIDIR  --  4.9*    Studies/Results: Ct Chest W Contrast  Result Date: 12/31/2016 CLINICAL DATA:  61 year old male with history of shortness of breath. Diffuse swelling. Distended abdomen. Jaundice. Elevated LFTs. Ascites. EXAM: CT CHEST, ABDOMEN, AND PELVIS WITH CONTRAST TECHNIQUE: Multidetector CT imaging of the chest, abdomen and pelvis was performed following the standard protocol during bolus administration of intravenous contrast. CONTRAST:  155mL ISOVUE-300 IOPAMIDOL (ISOVUE-300) INJECTION 61% COMPARISON:  No priors. FINDINGS: CT CHEST FINDINGS Cardiovascular: Heart size is normal. There is no significant pericardial fluid, thickening or pericardial calcification. There is aortic atherosclerosis, as well as atherosclerosis of the great vessels of the mediastinum and the coronary arteries, including calcified atherosclerotic plaque in the left main, left anterior descending and  right coronary arteries. Calcifications of the aortic valve. Mediastinum/Nodes: No pathologically enlarged mediastinal or hilar lymph nodes. Esophagus  is unremarkable in appearance. No axillary lymphadenopathy. Lungs/Pleura: No suspicious appearing pulmonary nodules or masses. No acute consolidative airspace disease. No pleural effusions. Areas of bibasilar subsegmental atelectasis. Musculoskeletal: There are no aggressive appearing lytic or blastic lesions noted in the visualized portions of the skeleton. CT ABDOMEN PELVIS FINDINGS Hepatobiliary: Liver has a shrunken appearance and nodular contour, compatible with underlying cirrhosis. No discrete cystic or solid hepatic lesions are identified. No intra or extrahepatic biliary ductal dilatation. Gallbladder appears moderately distended. No definite calcified gallstones are noted. Pancreas: No pancreatic mass. No pancreatic ductal dilatation. No well-defined peripancreatic fluid collections. Spleen: Unremarkable. Adrenals/Urinary Tract: Bilateral kidneys and bilateral adrenal glands are normal in appearance. No hydroureteronephrosis. Urinary bladder is normal in appearance. Stomach/Bowel: The appearance of the stomach is normal. There is no pathologic dilatation of small bowel or colon. Numerous colonic diverticulae are noted. Unfortunately, secondary to the large amount of ascites, accurate assessment for surrounding inflammatory changes is not possible on today's examination. Normal appendix. Vascular/Lymphatic: Aortic atherosclerosis, without evidence of aneurysm or dissection in the abdominal or pelvic vasculature. Numerous portosystemic collateral pathways are noted, most notable for large gastric varices which communicate to the left renal vein. The largest of these gastric varices is adjacent to the cardia of the stomach measuring up to 2.3 cm in diameter. No lymphadenopathy noted in the abdomen or pelvis. Reproductive: Prostate gland seminal vesicles are unremarkable in appearance. Other: Large volume of ascites.  No pneumoperitoneum. Musculoskeletal: Diffuse body wall edema. There are no aggressive  appearing lytic or blastic lesions noted in the visualized portions of the skeleton. IMPRESSION: 1. 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 2. Large volume of ascites.  Diffuse body wall edema. 3. No acute findings in the thorax. 4. Colonic diverticulosis. 5. Aortic atherosclerosis, in addition to left main and 2 vessel coronary artery disease. Please note that although the presence of coronary artery calcium documents the presence of coronary artery disease, the severity of this disease and any potential stenosis cannot be assessed on this non-gated CT examination. Assessment for potential risk factor modification, dietary therapy or pharmacologic therapy may be warranted, if clinically indicated. 6. There are calcifications of the aortic valve. Echocardiographic correlation for evaluation of potential valvular dysfunction may be warranted if clinically indicated. 7. Additional incidental findings, as above. Aortic Atherosclerosis (ICD10-I70.0). Electronically Signed   By: Vinnie Langton M.D.   On: 12/17/2016 15:27   Ct Abdomen Pelvis W Contrast  Result Date: 12/17/2016 CLINICAL DATA:  61 year old male with history of shortness of breath. Diffuse swelling. Distended abdomen. Jaundice. Elevated LFTs. Ascites. EXAM: CT CHEST, ABDOMEN, AND PELVIS WITH CONTRAST TECHNIQUE: Multidetector CT imaging of the chest, abdomen and pelvis was performed following the standard protocol during bolus administration of intravenous contrast. CONTRAST:  175mL ISOVUE-300 IOPAMIDOL (ISOVUE-300) INJECTION 61% COMPARISON:  No priors. FINDINGS: CT CHEST FINDINGS Cardiovascular: Heart size is normal. There is no significant pericardial fluid, thickening or pericardial calcification. There is aortic atherosclerosis, as well as atherosclerosis of the great vessels of the mediastinum and the coronary arteries,  including calcified atherosclerotic plaque in the left main, left anterior descending and right coronary arteries. Calcifications of the aortic valve. Mediastinum/Nodes: No pathologically enlarged mediastinal or hilar lymph nodes. Esophagus is unremarkable in appearance. No axillary lymphadenopathy. Lungs/Pleura: No suspicious appearing pulmonary  nodules or masses. No acute consolidative airspace disease. No pleural effusions. Areas of bibasilar subsegmental atelectasis. Musculoskeletal: There are no aggressive appearing lytic or blastic lesions noted in the visualized portions of the skeleton. CT ABDOMEN PELVIS FINDINGS Hepatobiliary: Liver has a shrunken appearance and nodular contour, compatible with underlying cirrhosis. No discrete cystic or solid hepatic lesions are identified. No intra or extrahepatic biliary ductal dilatation. Gallbladder appears moderately distended. No definite calcified gallstones are noted. Pancreas: No pancreatic mass. No pancreatic ductal dilatation. No well-defined peripancreatic fluid collections. Spleen: Unremarkable. Adrenals/Urinary Tract: Bilateral kidneys and bilateral adrenal glands are normal in appearance. No hydroureteronephrosis. Urinary bladder is normal in appearance. Stomach/Bowel: The appearance of the stomach is normal. There is no pathologic dilatation of small bowel or colon. Numerous colonic diverticulae are noted. Unfortunately, secondary to the large amount of ascites, accurate assessment for surrounding inflammatory changes is not possible on today's examination. Normal appendix. Vascular/Lymphatic: Aortic atherosclerosis, without evidence of aneurysm or dissection in the abdominal or pelvic vasculature. Numerous portosystemic collateral pathways are noted, most notable for large gastric varices which communicate to the left renal vein. The largest of these gastric varices is adjacent to the cardia of the stomach measuring up to 2.3 cm in diameter. No  lymphadenopathy noted in the abdomen or pelvis. Reproductive: Prostate gland seminal vesicles are unremarkable in appearance. Other: Large volume of ascites.  No pneumoperitoneum. Musculoskeletal: Diffuse body wall edema. There are no aggressive appearing lytic or blastic lesions noted in the visualized portions of the skeleton. IMPRESSION: 1. 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 2. Large volume of ascites.  Diffuse body wall edema. 3. No acute findings in the thorax. 4. Colonic diverticulosis. 5. Aortic atherosclerosis, in addition to left main and 2 vessel coronary artery disease. Please note that although the presence of coronary artery calcium documents the presence of coronary artery disease, the severity of this disease and any potential stenosis cannot be assessed on this non-gated CT examination. Assessment for potential risk factor modification, dietary therapy or pharmacologic therapy may be warranted, if clinically indicated. 6. There are calcifications of the aortic valve. Echocardiographic correlation for evaluation of potential valvular dysfunction may be warranted if clinically indicated. 7. Additional incidental findings, as above. Aortic Atherosclerosis (ICD10-I70.0). Electronically Signed   By: Vinnie Langton M.D.   On: 12/19/2016 15:27   Dg Chest Portable 1 View  Result Date: 12/21/2016 CLINICAL DATA:  Shortness of breath . EXAM: PORTABLE CHEST 1 VIEW COMPARISON:  05/15/2015 . FINDINGS: Heart size normal. Low lung volumes with mild basilar atelectasis. No pleural effusion or pneumothorax. Thoracic spine scoliosis . IMPRESSION: Low lung volumes with mild basilar atelectasis. Electronically Signed   By: Marcello Moores  Register   On: 12/21/2016 10:24    Impression: 61 year old male with history of alcohol abuse, presenting with decompensated cirrhosis likely secondary to  chronic ETOH with evidence of tense ascites, anasarca, alcoholic hepatitis, new onset anemia with heme positive stools but without overt GI bleeding.   Cirrhosis: new diagnosis and suspected due to chronic ETOH use. Will order Hep C antibody and Hep B surface antigen for now as I do not see this on file. Needs INR as well. AST elevated in pattern consistent with ETOH use. Bilirubin rising and may even peak higher before trending down in setting of likely alcoholic hepatitis. No evidence of obstruction on CT imaging. Mild asterixis on exam. Will check ammonia level  to have on file and likely will need lactulose dosing.   Alcoholic hepatitis: check INR now. Will likely need prednisolone if no contraindication but does not appear to have active infection that would raise concern for prednisolone therapy. Check Hep C antibody and Hep B surface antigen now as already planned.   Ascites: new onset in setting of ETOH cirrhosis. Low to no concern for SBP at this point but will order fluid analysis at time of paracentesis as this is his first paracentesis.   Anemia with heme positive stool: no overt GI bleeding. Hgb improved to 10.6 after 2 units PRBCs. Overdue for surveillance colonoscopy with history of tubulovillous adenomas, family history of colon cancer. Would pursue colonoscopy this admission along with EGD for variceal screening once clinically improved, possibly Monday or Tuesday.    Plan: Check INR, Hep B surface antigen and Hep C antibody, ammonia level Will likely need prednisolone therapy Continue to follow H/H Paracentesis when able with fluid analysis Colonoscopy/EGD at the earlier part of next week, non-urgent currently ETOH cessation: discussed extensively with patient Poor prognosis if continues to drink, also discussed with patient Will continue to follow with you and address labs as come available Repeat CBC, HFP, BMP, INR in am   Annitta Needs, PhD, ANP-BC [facility]      LOS: 1 day    12/08/2016, 10:37 AM

## 2016-12-08 NOTE — Progress Notes (Signed)
*  PRELIMINARY RESULTS* Echocardiogram 2D Echocardiogram has been performed.  Jesse Stafford 12/08/2016, 2:03 PM

## 2016-12-08 NOTE — Progress Notes (Addendum)
INR elevated at 2.3 in setting of decompensated liver disease. No overt GI bleeding. Discriminant function significantly elevated greater than 50, with greater than 32 indicating need for prednisolone if no contraindication. Awaiting Hep B surface antigen and Hep C antibody. If negative, start prednisolone 40 mg daily for total of 28 days with taper thereafter to be further outlined.   MELD Na is 28.   Could consider Vit K IV although in setting of chronic liver disease may not significantly change. Recheck INR, LFTs tomorrow.

## 2016-12-09 DIAGNOSIS — F10288 Alcohol dependence with other alcohol-induced disorder: Secondary | ICD-10-CM

## 2016-12-09 LAB — BLOOD GAS, ARTERIAL
ACID-BASE DEFICIT: 2 mmol/L (ref 0.0–2.0)
ACID-BASE DEFICIT: 3.6 mmol/L — AB (ref 0.0–2.0)
Acid-base deficit: 2.7 mmol/L — ABNORMAL HIGH (ref 0.0–2.0)
BICARBONATE: 21.5 mmol/L (ref 20.0–28.0)
BICARBONATE: 21.5 mmol/L (ref 20.0–28.0)
Bicarbonate: 21.2 mmol/L (ref 20.0–28.0)
DELIVERY SYSTEMS: POSITIVE
DRAWN BY: 105551
FIO2: 50
O2 CONTENT: 2 L/min
O2 Content: 4 L/min
O2 SAT: 56.7 %
O2 Saturation: 89.9 %
O2 Saturation: 96.2 %
PATIENT TEMPERATURE: 37
PCO2 ART: 55 mmHg — AB (ref 32.0–48.0)
PCO2 ART: 51.4 mmHg — AB (ref 32.0–48.0)
PCO2 ART: 42.5 mmHg (ref 32.0–48.0)
PH ART: 7.263 — AB (ref 7.350–7.450)
PH ART: 7.275 — AB (ref 7.350–7.450)
PO2 ART: 35.2 mmHg — AB (ref 83.0–108.0)
PO2 ART: 65.9 mmHg — AB (ref 83.0–108.0)
Patient temperature: 37
RATE: 8 resp/min
pH, Arterial: 7.323 — ABNORMAL LOW (ref 7.350–7.450)
pO2, Arterial: 86.2 mmHg (ref 83.0–108.0)

## 2016-12-09 LAB — CBC
HCT: 30.6 % — ABNORMAL LOW (ref 39.0–52.0)
Hemoglobin: 10.3 g/dL — ABNORMAL LOW (ref 13.0–17.0)
MCH: 33.1 pg (ref 26.0–34.0)
MCHC: 33.7 g/dL (ref 30.0–36.0)
MCV: 98.4 fL (ref 78.0–100.0)
PLATELETS: 88 10*3/uL — AB (ref 150–400)
RBC: 3.11 MIL/uL — ABNORMAL LOW (ref 4.22–5.81)
RDW: 17.5 % — AB (ref 11.5–15.5)
WBC: 9.4 10*3/uL (ref 4.0–10.5)

## 2016-12-09 LAB — BPAM RBC
BLOOD PRODUCT EXPIRATION DATE: 201807242359
BLOOD PRODUCT EXPIRATION DATE: 201807282359
ISSUE DATE / TIME: 201807062217
ISSUE DATE / TIME: 201807070212
UNIT TYPE AND RH: 5100
UNIT TYPE AND RH: 5100

## 2016-12-09 LAB — PROTIME-INR
INR: 2.9
PROTHROMBIN TIME: 30.9 s — AB (ref 11.4–15.2)

## 2016-12-09 LAB — TYPE AND SCREEN
ABO/RH(D): O POS
Antibody Screen: NEGATIVE
UNIT DIVISION: 0
UNIT DIVISION: 0

## 2016-12-09 LAB — COMPREHENSIVE METABOLIC PANEL
ALT: 26 U/L (ref 17–63)
ANION GAP: 3 — AB (ref 5–15)
AST: 49 U/L — ABNORMAL HIGH (ref 15–41)
Albumin: 1.5 g/dL — ABNORMAL LOW (ref 3.5–5.0)
Alkaline Phosphatase: 67 U/L (ref 38–126)
BUN: 39 mg/dL — ABNORMAL HIGH (ref 6–20)
CHLORIDE: 105 mmol/L (ref 101–111)
CO2: 26 mmol/L (ref 22–32)
CREATININE: 1.45 mg/dL — AB (ref 0.61–1.24)
Calcium: 7.9 mg/dL — ABNORMAL LOW (ref 8.9–10.3)
GFR, EST AFRICAN AMERICAN: 59 mL/min — AB (ref >60)
GFR, EST NON AFRICAN AMERICAN: 51 mL/min — AB (ref >60)
Glucose, Bld: 90 mg/dL (ref 65–99)
Potassium: 5.1 mmol/L (ref 3.5–5.1)
Sodium: 134 mmol/L — ABNORMAL LOW (ref 135–145)
Total Bilirubin: 8.9 mg/dL — ABNORMAL HIGH (ref 0.3–1.2)
Total Protein: 5.4 g/dL — ABNORMAL LOW (ref 6.5–8.1)

## 2016-12-09 LAB — HIV ANTIBODY (ROUTINE TESTING W REFLEX): HIV SCREEN 4TH GENERATION: NONREACTIVE

## 2016-12-09 LAB — AMMONIA: AMMONIA: 87 umol/L — AB (ref 9–35)

## 2016-12-09 MED ORDER — IPRATROPIUM-ALBUTEROL 0.5-2.5 (3) MG/3ML IN SOLN
3.0000 mL | RESPIRATORY_TRACT | Status: DC
  Administered 2016-12-09 – 2016-12-11 (×10): 3 mL via RESPIRATORY_TRACT
  Filled 2016-12-09 (×10): qty 3

## 2016-12-09 MED ORDER — LORAZEPAM 1 MG PO TABS: 1.0000 mg | ORAL_TABLET | Freq: Four times a day (QID) | ORAL | Status: DC | PRN

## 2016-12-09 MED ORDER — ALBUTEROL SULFATE (2.5 MG/3ML) 0.083% IN NEBU: 3.0000 mL | INHALATION_SOLUTION | RESPIRATORY_TRACT | Status: DC | PRN

## 2016-12-09 MED ORDER — SODIUM CHLORIDE 0.9 % IV BOLUS (SEPSIS)
500.0000 mL | Freq: Once | INTRAVENOUS | Status: AC
  Administered 2016-12-09: 500 mL via INTRAVENOUS

## 2016-12-09 MED ORDER — ALBUMIN HUMAN 25 % IV SOLN
12.5000 g | Freq: Once | INTRAVENOUS | Status: AC
  Administered 2016-12-09: 12.5 g via INTRAVENOUS
  Filled 2016-12-09 (×2): qty 50

## 2016-12-09 MED ORDER — ALBUMIN HUMAN 25 % IV SOLN
INTRAVENOUS | Status: AC
  Filled 2016-12-09: qty 50

## 2016-12-09 MED ORDER — LACTULOSE 10 GM/15ML PO SOLN
20.0000 g | Freq: Three times a day (TID) | ORAL | Status: DC
  Administered 2016-12-09 (×2): 20 g via ORAL
  Filled 2016-12-09 (×2): qty 30

## 2016-12-09 MED ORDER — SODIUM CHLORIDE 0.9 % IV SOLN
INTRAVENOUS | Status: DC
  Administered 2016-12-09 – 2016-12-11 (×3): via INTRAVENOUS

## 2016-12-09 MED ORDER — LORAZEPAM 2 MG/ML IJ SOLN
1.0000 mg | Freq: Four times a day (QID) | INTRAMUSCULAR | Status: DC | PRN
  Administered 2016-12-10 (×2): 1 mg via INTRAVENOUS
  Filled 2016-12-09 (×2): qty 1

## 2016-12-09 MED ORDER — ALBUMIN HUMAN 25 % IV SOLN
25.0000 g | Freq: Once | INTRAVENOUS | Status: AC
  Administered 2016-12-09: 25 g via INTRAVENOUS
  Filled 2016-12-09: qty 100

## 2016-12-09 MED ORDER — ATENOLOL 25 MG PO TABS: 25.0000 mg | ORAL_TABLET | Freq: Every day | ORAL | Status: DC

## 2016-12-09 NOTE — Progress Notes (Signed)
Pt. Transferred to ICU without difficulty. [facility] contact Ranae Pila notified and will contact son.

## 2016-12-09 NOTE — Progress Notes (Signed)
Subjective: Denies abdominal pain. States he feels his swelling is better. No N/V. Drowsy but easily awakens.   Objective: Vital signs in last 24 hours: Temp:  [97.8 F (36.6 C)-97.9 F (36.6 C)] 97.8 F (36.6 C) (07/08 0306) Pulse Rate:  [45-71] 71 (07/08 0306) Resp:  [18-20] 20 (07/08 0306) BP: (93-121)/(49-90) 98/49 (07/08 0306) SpO2:  [93 %-94 %] 93 % (07/08 0306) Weight:  [188 lb 6.4 oz (85.5 kg)] 188 lb 6.4 oz (85.5 kg) (07/08 0651) Last BM Date: 12/05/16 General:   Drowsy but easily awakening. Appears more anxious today Eyes:  +scleral icterus  Mouth:  Without lesions, mucosa pink and moist.  Abdomen:  Bowel sounds present, moderately tense ascites, no TTP. Anasarca to flanks Extremities:  With 2+ pitting lower extremity edema, 1+ pitting edema thighs  Neurologic:  oriented x4; mild asterixis on exam Psych:  Flat affect  Intake/Output from previous day: 07/07 0701 - 07/08 0700 In: 723 [P.O.:720; I.V.:3] Out: 300 [Urine:300] Intake/Output this shift: No intake/output data recorded.  Lab Results:  Recent Labs  12/14/2016 1018 12/08/16 0550 12/09/16 0609  WBC 8.8 11.3* 9.4  HGB 8.3* 10.6* 10.3*  HCT 23.9* 31.4* 30.6*  PLT 95* 89* 88*   BMET  Recent Labs  12/02/2016 1018 12/08/16 0550 12/09/16 0609  NA 127* 133* 134*  K 4.4 4.6 5.1  CL 99* 102 105  CO2 22 26 26   GLUCOSE 120* 107* 90  BUN 27* 30* 39*  CREATININE 0.88 0.95 1.45*  CALCIUM 7.9* 8.1* 7.9*   LFT  Recent Labs  12/27/2016 1018 12/08/16 0550 12/09/16 0609  PROT 6.7 6.9 5.4*  ALBUMIN 2.0* 2.0* 1.5*  AST 87* 66* 49*  ALT 36 37 26  ALKPHOS 78 74 67  BILITOT 7.1* 11.4* 8.9*  BILIDIR  --  4.9*  --    PT/INR  Recent Labs  12/08/16 1138 12/09/16 0609  LABPROT 25.7* 30.9*  INR 2.30 2.90    Studies/Results: Ct Chest W Contrast  Result Date: 12/05/2016 CLINICAL DATA:  61 year old male with history of shortness of breath. Diffuse swelling. Distended abdomen. Jaundice. Elevated LFTs.  Ascites. EXAM: CT CHEST, ABDOMEN, AND PELVIS WITH CONTRAST TECHNIQUE: Multidetector CT imaging of the chest, abdomen and pelvis was performed following the standard protocol during bolus administration of intravenous contrast. CONTRAST:  165mL ISOVUE-300 IOPAMIDOL (ISOVUE-300) INJECTION 61% COMPARISON:  No priors. FINDINGS: CT CHEST FINDINGS Cardiovascular: Heart size is normal. There is no significant pericardial fluid, thickening or pericardial calcification. There is aortic atherosclerosis, as well as atherosclerosis of the great vessels of the mediastinum and the coronary arteries, including calcified atherosclerotic plaque in the left main, left anterior descending and right coronary arteries. Calcifications of the aortic valve. Mediastinum/Nodes: No pathologically enlarged mediastinal or hilar lymph nodes. Esophagus is unremarkable in appearance. No axillary lymphadenopathy. Lungs/Pleura: No suspicious appearing pulmonary nodules or masses. No acute consolidative airspace disease. No pleural effusions. Areas of bibasilar subsegmental atelectasis. Musculoskeletal: There are no aggressive appearing lytic or blastic lesions noted in the visualized portions of the skeleton. CT ABDOMEN PELVIS FINDINGS Hepatobiliary: Liver has a shrunken appearance and nodular contour, compatible with underlying cirrhosis. No discrete cystic or solid hepatic lesions are identified. No intra or extrahepatic biliary ductal dilatation. Gallbladder appears moderately distended. No definite calcified gallstones are noted. Pancreas: No pancreatic mass. No pancreatic ductal dilatation. No well-defined peripancreatic fluid collections. Spleen: Unremarkable. Adrenals/Urinary Tract: Bilateral kidneys and bilateral adrenal glands are normal in appearance. No hydroureteronephrosis. Urinary bladder is normal in appearance.  Stomach/Bowel: The appearance of the stomach is normal. There is no pathologic dilatation of small bowel or colon. Numerous  colonic diverticulae are noted. Unfortunately, secondary to the large amount of ascites, accurate assessment for surrounding inflammatory changes is not possible on today's examination. Normal appendix. Vascular/Lymphatic: Aortic atherosclerosis, without evidence of aneurysm or dissection in the abdominal or pelvic vasculature. Numerous portosystemic collateral pathways are noted, most notable for large gastric varices which communicate to the left renal vein. The largest of these gastric varices is adjacent to the cardia of the stomach measuring up to 2.3 cm in diameter. No lymphadenopathy noted in the abdomen or pelvis. Reproductive: Prostate gland seminal vesicles are unremarkable in appearance. Other: Large volume of ascites.  No pneumoperitoneum. Musculoskeletal: Diffuse body wall edema. There are no aggressive appearing lytic or blastic lesions noted in the visualized portions of the skeleton. IMPRESSION: 1. 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 2. Large volume of ascites.  Diffuse body wall edema. 3. No acute findings in the thorax. 4. Colonic diverticulosis. 5. Aortic atherosclerosis, in addition to left main and 2 vessel coronary artery disease. Please note that although the presence of coronary artery calcium documents the presence of coronary artery disease, the severity of this disease and any potential stenosis cannot be assessed on this non-gated CT examination. Assessment for potential risk factor modification, dietary therapy or pharmacologic therapy may be warranted, if clinically indicated. 6. There are calcifications of the aortic valve. Echocardiographic correlation for evaluation of potential valvular dysfunction may be warranted if clinically indicated. 7. Additional incidental findings, as above. Aortic Atherosclerosis (ICD10-I70.0). Electronically Signed   By: Vinnie Langton M.D.   On: 12/06/2016 15:27   Ct Abdomen Pelvis W Contrast  Result Date: 12/10/2016 CLINICAL DATA:  61 year old male with history of shortness of breath. Diffuse swelling. Distended abdomen. Jaundice. Elevated LFTs. Ascites. EXAM: CT CHEST, ABDOMEN, AND PELVIS WITH CONTRAST TECHNIQUE: Multidetector CT imaging of the chest, abdomen and pelvis was performed following the standard protocol during bolus administration of intravenous contrast. CONTRAST:  128mL ISOVUE-300 IOPAMIDOL (ISOVUE-300) INJECTION 61% COMPARISON:  No priors. FINDINGS: CT CHEST FINDINGS Cardiovascular: Heart size is normal. There is no significant pericardial fluid, thickening or pericardial calcification. There is aortic atherosclerosis, as well as atherosclerosis of the great vessels of the mediastinum and the coronary arteries, including calcified atherosclerotic plaque in the left main, left anterior descending and right coronary arteries. Calcifications of the aortic valve. Mediastinum/Nodes: No pathologically enlarged mediastinal or hilar lymph nodes. Esophagus is unremarkable in appearance. No axillary lymphadenopathy. Lungs/Pleura: No suspicious appearing pulmonary nodules or masses. No acute consolidative airspace disease. No pleural effusions. Areas of bibasilar subsegmental atelectasis. Musculoskeletal: There are no aggressive appearing lytic or blastic lesions noted in the visualized portions of the skeleton. CT ABDOMEN PELVIS FINDINGS Hepatobiliary: Liver has a shrunken appearance and nodular contour, compatible with underlying cirrhosis. No discrete cystic or solid hepatic lesions are identified. No intra or extrahepatic biliary ductal dilatation. Gallbladder appears moderately distended. No definite calcified gallstones are noted. Pancreas: No pancreatic mass. No pancreatic ductal dilatation. No well-defined peripancreatic fluid collections. Spleen: Unremarkable. Adrenals/Urinary Tract: Bilateral kidneys and bilateral  adrenal glands are normal in appearance. No hydroureteronephrosis. Urinary bladder is normal in appearance. Stomach/Bowel: The appearance of the stomach is normal. There is no pathologic dilatation of small bowel or colon. Numerous colonic diverticulae are noted. Unfortunately, secondary to the large amount of  ascites, accurate assessment for surrounding inflammatory changes is not possible on today's examination. Normal appendix. Vascular/Lymphatic: Aortic atherosclerosis, without evidence of aneurysm or dissection in the abdominal or pelvic vasculature. Numerous portosystemic collateral pathways are noted, most notable for large gastric varices which communicate to the left renal vein. The largest of these gastric varices is adjacent to the cardia of the stomach measuring up to 2.3 cm in diameter. No lymphadenopathy noted in the abdomen or pelvis. Reproductive: Prostate gland seminal vesicles are unremarkable in appearance. Other: Large volume of ascites.  No pneumoperitoneum. Musculoskeletal: Diffuse body wall edema. There are no aggressive appearing lytic or blastic lesions noted in the visualized portions of the skeleton. IMPRESSION: 1. 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 2. Large volume of ascites.  Diffuse body wall edema. 3. No acute findings in the thorax. 4. Colonic diverticulosis. 5. Aortic atherosclerosis, in addition to left main and 2 vessel coronary artery disease. Please note that although the presence of coronary artery calcium documents the presence of coronary artery disease, the severity of this disease and any potential stenosis cannot be assessed on this non-gated CT examination. Assessment for potential risk factor modification, dietary therapy or pharmacologic therapy may be warranted, if clinically indicated. 6. There are calcifications of the aortic valve.  Echocardiographic correlation for evaluation of potential valvular dysfunction may be warranted if clinically indicated. 7. Additional incidental findings, as above. Aortic Atherosclerosis (ICD10-I70.0). Electronically Signed   By: Vinnie Langton M.D.   On: 12/04/2016 15:27   Dg Chest Portable 1 View  Result Date: 12/10/2016 CLINICAL DATA:  Shortness of breath . EXAM: PORTABLE CHEST 1 VIEW COMPARISON:  05/15/2015 . FINDINGS: Heart size normal. Low lung volumes with mild basilar atelectasis. No pleural effusion or pneumothorax. Thoracic spine scoliosis . IMPRESSION: Low lung volumes with mild basilar atelectasis. Electronically Signed   By: Marcello Moores  Register   On: 12/16/2016 10:24    Assessment: 61 year old male with history of alcohol abuse, presenting with decompensated cirrhosis likely secondary to chronic ETOH with evidence of tense ascites, anasarca, alcoholic hepatitis, new onset anemia with heme positive stools but without overt GI bleeding.   Cirrhosis: new diagnosis and suspected due to chronic ETOH use. MELD Na 28 on admission, now 32. Discriminant function 80.  Hep C antibody and Hep B surface antigen still pending and likely to be resulted this evening per laboratory as it is a "send out". Appears drowsy today with noted agitation overnight. Likely multifactorial in setting of ETOH withdrawal and possible underlying encephalopathy. Increasing lactulose today. Bilirubin 11.4 yesterday and now 8.9. AST improved. Will continue to trend LFTs.   Alcoholic hepatitis: prednisolone to start after review of Hep B and C. INR increased from yesterday.   Ascites/anasarca: new onset in setting of ETOH cirrhosis. Significant hypoalbuminemia multifactorial. Low to no concern for SBP at this point. Plans for 7/9 para with fluid analyses. Continue Lasix IV, fluid restriction, low sodium diet.   Anemia with heme positive stool: no overt GI bleeding. Hgb remaining stable in the 10 range after 2 units  PRBCs. Overdue for surveillance colonoscopy with history of tubulovillous adenomas, family history of colon cancer. Would pursue colonoscopy this admission along with EGD for variceal screening once clinically improved.  Patient not a candidate for liver transplantation in setting of ETOH use. Poor prognosis for patient at this time. Discussed previously cessation of ETOH.    Plan: Increase  lactulose to TID with goal of 2-3 soft BMs per day Follow-up on pending Hep B and C serologies Will start prednisolone once Hep B and C status is obtained Continue to follow labs Hold off on Vit K IV as unlikely to change clinical course 2 gram sodium diet Fluid restriction Continue Lasix IV [place] with paracentesis and fluid analysis 7/9 Colonoscopy/EGD when clinically stable but anticipate more towards early/middle upcoming week ETOH cessation   Annitta Needs, PhD, ANP-BC [facility]     LOS: 2 days    12/09/2016, 8:20 AM

## 2016-12-09 NOTE — Progress Notes (Addendum)
PROGRESS NOTE  Jesse Stafford HYW:[id number] DOB: 1956-03-03 DOA: 12/17/2016 PCP: Royce Macadamia D., PA-C  Brief Narrative: 61 year old man PMH ongoing alcohol use, hypertension presented with 6 week history of shortness of breath, dyspnea on exertion and orthopnea. Admitted for decompensated alcoholic cirrhosis, acute alcoholic hepatitis.  Assessment/Plan #1: Decompensated alcoholic cirrhosis with associated portal hypertension, gastric varices, hyponatremia, thrombocytopenia, large volume ascites and massive volume overload. MELD-Na 28 on admission (27-32% estimated 90-day mortality) -Concern for developing hepatic encephalopathy. Increase lactulose. -Continue supportive care. -Large-volume paracentesis 7/9. -Appreciate GI involvement.  #2: Acute alcoholic hepatitis. Total bilirubin somewhat improved today. -discriminant function 81.1 -Awaiting hepatitis B and C serology before starting prednisolone therapy   #3: Alcohol dependence versus abuse -Continue to monitor for withdrawal.  -Strict abstinence recommended.  Aortic atherosclerosis, two-vessel coronary artery disease by CT imaging. Asymptomatic. Follow-up as an outpatient.  Prognosis is guarded, long-term prognosis is poor. Consult palliative care 7/9.  ADDENDUM 1845 Exam 1800 patient awakes to voice, states name, but quickly falls asleep CV RRR Lungs bilateral wheezes  ABG shows acute resp acidosis. Plan transfer to SDU, BiPAP, repeat ABG  DVT prophylaxis: SCDs Code Status: full Family Communication: none Disposition Plan: home    Murray Hodgkins, MD  [facility] Direct contact: [phone] --[place]ia [place]  --[email or web address]; password [email or web address]  7PM-7AM contact night coverage as above 12/09/2016, 1:12 PM  LOS: 2 days   Consultants:  Gastroenterology   Procedures:  7/5 transfusion 2 units packed red blood cells  Antimicrobials:    Interval history/Subjective: Somnolent  Objective: Vitals:  Afebrile. 97.8, 20, 71, 98/49, 93% on 2 L  Exam:     Constitutional: Appears chronically ill, somnolent.  Eyes: Pupils, irises, lids appear unremarkable.  ENT: Lips appear unremarkable.  Cardiovascular: Regular rate and rhythm. No murmur, regular. No change in 2-3 plus bilateral lower extremity edema  Respiratory: Clear to auscultation bilaterally. No wheezes, rales or rhonchi. Normal respiratory effort.  Abdomen: Soft, distended, mild generalized tenderness  Psychiatric: Cannot assess at this point secondary to somnolence.   I have personally reviewed the following:  Urine output reported as 300+ unrecorded void  Labs:   Potassium 5.1, BUN up to 39, creatinine up to 1.45 with diuresis. AST trending down, 49. Albumin 1.5. Ammonia up to 87. Total bilirubin down to 8.9.  Hemoglobin stable 10.3. Leukocytosis has resolved. Platelet count stable 88.  INR 2.9  Imaging studies:    Medical tests:    Test discussed with performing physician:    Decision to obtain old records:    Review and summation of old records:  08/2011 echocardiogram: LVEF 60-65 percent with normal wall motion.  Scheduled Meds: . [START ON 12/10/2016] atenolol  25 mg Oral Daily  . cloNIDine  0.1 mg Oral BID  . folic acid  1 mg Oral Daily  . lactulose  20 g Oral TID  . multivitamin with minerals  1 tablet Oral Daily  . pantoprazole (PROTONIX) IV  40 mg Intravenous Q12H  . sodium chloride flush  3 mL Intravenous Q12H  . thiamine  100 mg Oral Daily   Or  . thiamine  100 mg Intravenous Daily   Continuous Infusions: . sodium chloride      Principal Problem:   Alcoholic cirrhosis of liver with ascites (HCC) Active Problems:   Thrombocytopenia (HCC)   Alcohol dependence (HCC)   Heme positive stool   Hyperbilirubinemia   Alcoholic hepatitis with ascites   Aortic atherosclerosis (HCC)   CAD (coronary artery disease)  LOS: 2 days

## 2016-12-09 NOTE — Progress Notes (Signed)
CRITICAL VALUE ALERT  Critical Value:  ABG Panic Results called by RT PH 7.263 PCO2 55.0 PO2 35.2 BICARB 21.5 FO2 56.7   Date & Time Notied:  0802 12/09/2016  Provider Notified: Dr Sarajane Jews notified via text page on AMION  Orders Received/Actions taken: Dr Sarajane Jews returned call and will place orders for pt to transfer to [facility] to be placed on Bipap.  Patients RN, Magda Paganini notified.

## 2016-12-10 ENCOUNTER — Encounter (HOSPITAL_COMMUNITY): Payer: Self-pay | Admitting: Primary Care

## 2016-12-10 ENCOUNTER — Inpatient Hospital Stay (HOSPITAL_COMMUNITY): Payer: Medicaid Other

## 2016-12-10 DIAGNOSIS — K7031 Alcoholic cirrhosis of liver with ascites: Secondary | ICD-10-CM

## 2016-12-10 DIAGNOSIS — Z7189 Other specified counseling: Secondary | ICD-10-CM

## 2016-12-10 DIAGNOSIS — J9621 Acute and chronic respiratory failure with hypoxia: Secondary | ICD-10-CM

## 2016-12-10 DIAGNOSIS — Z515 Encounter for palliative care: Secondary | ICD-10-CM

## 2016-12-10 DIAGNOSIS — N179 Acute kidney failure, unspecified: Secondary | ICD-10-CM

## 2016-12-10 LAB — POTASSIUM: Potassium: 5.8 mmol/L — ABNORMAL HIGH (ref 3.5–5.1)

## 2016-12-10 LAB — BODY FLUID CELL COUNT WITH DIFFERENTIAL
EOS FL: 0 %
Lymphs, Fluid: 11 %
MONOCYTE-MACROPHAGE-SEROUS FLUID: 42 % — AB (ref 50–90)
NEUTROPHIL FLUID: 47 % — AB (ref 0–25)
Other Cells, Fluid: 0 %
Total Nucleated Cell Count, Fluid: 159 cu mm (ref 0–1000)

## 2016-12-10 LAB — BLOOD GAS, ARTERIAL
ACID-BASE DEFICIT: 5.8 mmol/L — AB (ref 0.0–2.0)
Bicarbonate: 19.5 mmol/L — ABNORMAL LOW (ref 20.0–28.0)
DELIVERY SYSTEMS: POSITIVE
DRAWN BY: 270161
EXPIRATORY PAP: 6
FIO2: 50
Inspiratory PAP: 14
LHR: 8 {breaths}/min
O2 SAT: 95.9 %
PCO2 ART: 41.9 mmHg (ref 32.0–48.0)
Patient temperature: 37
pH, Arterial: 7.293 — ABNORMAL LOW (ref 7.350–7.450)
pO2, Arterial: 87.8 mmHg (ref 83.0–108.0)

## 2016-12-10 LAB — GRAM STAIN: Gram Stain: NONE SEEN

## 2016-12-10 LAB — NA AND K (SODIUM & POTASSIUM), RAND UR
POTASSIUM UR: 32 mmol/L
Sodium, Ur: 37 mmol/L

## 2016-12-10 LAB — LACTIC ACID, PLASMA
LACTIC ACID, VENOUS: 5.2 mmol/L — AB (ref 0.5–1.9)
LACTIC ACID, VENOUS: 5.5 mmol/L — AB (ref 0.5–1.9)
Lactic Acid, Venous: 4.1 mmol/L (ref 0.5–1.9)

## 2016-12-10 LAB — CBC
HCT: 28 % — ABNORMAL LOW (ref 39.0–52.0)
Hemoglobin: 9.4 g/dL — ABNORMAL LOW (ref 13.0–17.0)
MCH: 33.3 pg (ref 26.0–34.0)
MCHC: 33.6 g/dL (ref 30.0–36.0)
MCV: 99.3 fL (ref 78.0–100.0)
PLATELETS: 113 10*3/uL — AB (ref 150–400)
RBC: 2.82 MIL/uL — ABNORMAL LOW (ref 4.22–5.81)
RDW: 17.2 % — AB (ref 11.5–15.5)
WBC: 7.5 10*3/uL (ref 4.0–10.5)

## 2016-12-10 LAB — HEPATITIS B SURFACE ANTIGEN: Hepatitis B Surface Ag: NEGATIVE

## 2016-12-10 LAB — COMPREHENSIVE METABOLIC PANEL
ALK PHOS: 57 U/L (ref 38–126)
ALT: 31 U/L (ref 17–63)
ANION GAP: 8 (ref 5–15)
AST: 94 U/L — ABNORMAL HIGH (ref 15–41)
Albumin: 2.4 g/dL — ABNORMAL LOW (ref 3.5–5.0)
BUN: 49 mg/dL — ABNORMAL HIGH (ref 6–20)
CALCIUM: 8 mg/dL — AB (ref 8.9–10.3)
CHLORIDE: 105 mmol/L (ref 101–111)
CO2: 20 mmol/L — AB (ref 22–32)
CREATININE: 2.3 mg/dL — AB (ref 0.61–1.24)
GFR calc Af Amer: 34 mL/min — ABNORMAL LOW (ref >60)
GFR, EST NON AFRICAN AMERICAN: 29 mL/min — AB (ref >60)
Glucose, Bld: 83 mg/dL (ref 65–99)
Potassium: 6.3 mmol/L (ref 3.5–5.1)
SODIUM: 133 mmol/L — AB (ref 135–145)
Total Bilirubin: 8.1 mg/dL — ABNORMAL HIGH (ref 0.3–1.2)
Total Protein: 6 g/dL — ABNORMAL LOW (ref 6.5–8.1)

## 2016-12-10 LAB — CORTISOL

## 2016-12-10 LAB — GLUCOSE, CAPILLARY: GLUCOSE-CAPILLARY: 130 mg/dL — AB (ref 65–99)

## 2016-12-10 LAB — MRSA PCR SCREENING: MRSA BY PCR: NEGATIVE

## 2016-12-10 LAB — AMMONIA: Ammonia: 111 umol/L — ABNORMAL HIGH (ref 9–35)

## 2016-12-10 LAB — PROTIME-INR
INR: 3.59
PROTHROMBIN TIME: 36.7 s — AB (ref 11.4–15.2)

## 2016-12-10 LAB — HEPATITIS C ANTIBODY

## 2016-12-10 LAB — PROCALCITONIN: PROCALCITONIN: 0.66 ng/mL

## 2016-12-10 MED ORDER — SODIUM CHLORIDE 0.9 % IV BOLUS (SEPSIS)
500.0000 mL | Freq: Once | INTRAVENOUS | Status: AC
  Administered 2016-12-10: 500 mL via INTRAVENOUS

## 2016-12-10 MED ORDER — SODIUM CHLORIDE 0.9 % IV SOLN
0.0000 ug/min | INTRAVENOUS | Status: DC
  Administered 2016-12-10: 100 ug/min via INTRAVENOUS
  Administered 2016-12-10: 80 ug/min via INTRAVENOUS
  Administered 2016-12-11: 200 ug/min via INTRAVENOUS
  Administered 2016-12-11: 150 ug/min via INTRAVENOUS
  Filled 2016-12-10 (×2): qty 4

## 2016-12-10 MED ORDER — SODIUM CHLORIDE 0.9 % IV SOLN
Freq: Once | INTRAVENOUS | Status: AC
  Administered 2016-12-10: 03:00:00 via INTRAVENOUS

## 2016-12-10 MED ORDER — VANCOMYCIN HCL IN DEXTROSE 750-5 MG/150ML-% IV SOLN
750.0000 mg | Freq: Two times a day (BID) | INTRAVENOUS | Status: DC
Start: 2016-12-10 — End: 2016-12-10
  Filled 2016-12-10 (×2): qty 150

## 2016-12-10 MED ORDER — HYDROCORTISONE NA SUCCINATE PF 100 MG IJ SOLR
100.0000 mg | Freq: Four times a day (QID) | INTRAMUSCULAR | Status: DC
Start: 2016-12-10 — End: 2016-12-11
  Administered 2016-12-10 – 2016-12-11 (×4): 100 mg via INTRAVENOUS
  Filled 2016-12-10 (×4): qty 2

## 2016-12-10 MED ORDER — DEXTROSE 50 % IV SOLN
1.0000 | Freq: Once | INTRAVENOUS | Status: AC
  Administered 2016-12-10: 50 mL via INTRAVENOUS
  Filled 2016-12-10: qty 50

## 2016-12-10 MED ORDER — ALBUMIN HUMAN 25 % IV SOLN
INTRAVENOUS | Status: AC
  Filled 2016-12-10: qty 50

## 2016-12-10 MED ORDER — ALBUMIN HUMAN 5 % IV SOLN
12.5000 g | Freq: Once | INTRAVENOUS | Status: AC
Start: 2016-12-10 — End: 2016-12-10
  Administered 2016-12-10: 12.5 g via INTRAVENOUS
  Filled 2016-12-10: qty 250

## 2016-12-10 MED ORDER — SODIUM CHLORIDE 0.9 % IV SOLN
0.0000 ug/min | INTRAVENOUS | Status: DC
  Administered 2016-12-10: 50 ug/min via INTRAVENOUS
  Administered 2016-12-10: 60 ug/min via INTRAVENOUS
  Administered 2016-12-10: 20 ug/min via INTRAVENOUS
  Filled 2016-12-10 (×2): qty 1

## 2016-12-10 MED ORDER — VANCOMYCIN HCL 10 G IV SOLR
1500.0000 mg | Freq: Once | INTRAVENOUS | Status: AC
  Administered 2016-12-10: 1500 mg via INTRAVENOUS
  Filled 2016-12-10: qty 1500

## 2016-12-10 MED ORDER — VANCOMYCIN HCL 10 G IV SOLR
1250.0000 mg | INTRAVENOUS | Status: DC
  Filled 2016-12-10 (×2): qty 1250

## 2016-12-10 MED ORDER — PHENYLEPHRINE HCL-NACL 10-0.9 MG/250ML-% IV SOLN
INTRAVENOUS | Status: AC
  Filled 2016-12-10: qty 250

## 2016-12-10 MED ORDER — INSULIN ASPART 100 UNIT/ML IV SOLN
10.0000 [IU] | Freq: Once | INTRAVENOUS | Status: AC
  Administered 2016-12-10: 10 [IU] via INTRAVENOUS

## 2016-12-10 MED ORDER — DEXTROSE 5 % IV SOLN
1.0000 g | Freq: Three times a day (TID) | INTRAVENOUS | Status: DC
  Filled 2016-12-10 (×5): qty 1

## 2016-12-10 MED ORDER — PIPERACILLIN-TAZOBACTAM 3.375 G IVPB
3.3750 g | Freq: Three times a day (TID) | INTRAVENOUS | Status: DC
  Administered 2016-12-10 – 2016-12-11 (×3): 3.375 g via INTRAVENOUS
  Filled 2016-12-10 (×3): qty 50

## 2016-12-10 MED ORDER — ALBUTEROL SULFATE (2.5 MG/3ML) 0.083% IN NEBU
10.0000 mg | INHALATION_SOLUTION | Freq: Once | RESPIRATORY_TRACT | Status: AC
  Administered 2016-12-10: 10 mg via RESPIRATORY_TRACT
  Filled 2016-12-10: qty 12

## 2016-12-10 MED ORDER — SODIUM CHLORIDE 0.9 % IV BOLUS (SEPSIS)
1500.0000 mL | Freq: Once | INTRAVENOUS | Status: AC
  Administered 2016-12-10: 1500 mL via INTRAVENOUS

## 2016-12-10 NOTE — Progress Notes (Signed)
Old foley removed, and new foley placed with no problems. Tolerated well. Clear yellow urine returned in urimeter.

## 2016-12-10 NOTE — Progress Notes (Signed)
Ordered bolus completed. Notified physician for continued hypotension.

## 2016-12-10 NOTE — Progress Notes (Signed)
Spoke with mid level on patient arrival to ICU - orders completed. Spoke with Dr Bonner Puna on his visit to patient room in ICU - orders completed.

## 2016-12-10 NOTE — Progress Notes (Signed)
CRITICAL VALUE ALERT  Critical Value:  Lactic 4.1      Potassium 6.3  Date & Time Notied:  12/10/2016   1140  Provider Notified: Dr. Sarajane Jews

## 2016-12-10 NOTE — Procedures (Signed)
PreOperative Dx: Alcoholic hepatitis, ascites Postoperative Dx: Alcoholic hepatitis, ascites Procedure:   US guided paracentesis Radiologist:  Thornton Papas Anesthesia:  10 ml of1% lidocaine Specimen:  120 mL of yellow ascitic fluid EBL:   < 1 ml Complications: None

## 2016-12-10 NOTE — Progress Notes (Signed)
Consent available. Orders in process.

## 2016-12-10 NOTE — Progress Notes (Signed)
Attempted to contact listed family member in order to obtain consent for Paracentesis. Received no answer. Left voicemail. RN will try to contact again.  Margaret Pyle, RN

## 2016-12-10 NOTE — Progress Notes (Addendum)
PROGRESS NOTE  Jesse Stafford OYD:[id number] DOB: [birth year] DOA: 12/04/2016 PCP: Royce Macadamia D., PA-C  Brief Narrative: 61 year old man PMH ongoing alcohol use, hypertension presented with 6 week history of shortness of breath, dyspnea on exertion and orthopnea. Admitted for decompensated alcoholic cirrhosis, acute alcoholic hepatitis.  Assessment/Plan Decompensated alcoholic cirrhosis with associated portal hypertension, gastric varices, hyponatremia, thrombocytopenia, large volume ascites, massive volume overload, coagulopathy, suspected hepatic encephalopathy. MELD-Na 28 on admission (27-32% estimated 90-day mortality) -CMP and INR pending. Continue supportive care, appreciate GI.  Acute alcoholic hepatitis. Hepatitis C antibody positive. -Management per gastroenterology.  Acute hypoxic, hypercapnic respiratory failure, suspect multifactorial including COPD; he has no fever or leukocytosis but is immunosuppressed. Differential includes pneumonia although edema given volume resuscitation is also a consideration. -Currently protecting airway, continue BiPAP, repeat ABG shows normalization of PCO2 and PO2. Does not currently need intubation but monitor closely. -Bronchodilators, supplemental oxygen -Empiric antibiotics -Diuresis blood pressure allows  Refractory hypotension, now on vasopressor, etiology unclear. Afebrile, no leukocytosis. Although pneumonia is a consideration, suspect intravascular volume depletion rather than sepsis. -Diagnostic paracentesis to exclude SBP -Check lactic acid -Add empiric antibiotics -Check serum cortisol, add empiric stress dose steroids -Continue vasopressor  Alcohol abuse vs dependence with possible acute alcohol withdrawal  Aortic atherosclerosis, two-vessel coronary artery disease by CT imaging  Patient is critically ill with liver failure, respiratory failure and refractory hypotension. Prognosis is guarded. May not survive this  hospitalization. Palliative medicine consult. Will discuss with any family available.  ADDENDUM 1400 Discussed in detail with brother Ali Lowe and nephew, as well as Quinn Axe; discussed critical illness and high possibility of not surviving hospitalization. Ali Lowe and nephew requested DNR status.  ADDENDUM 1900 Patient remains encephalopathic, does not respond to voice or touch. BP and potassium better but hypoxia worsening, poor UOP. Critically ill with multi-system failure (liver, renal, pulmonary). No evidence of SBP. Continue broad spectrum abx, vasopressor, IVF, BiPAP, serial potassium levels. Prognosis poor.  DVT prophylaxis: SCDs Code Status: DNR Family Communication: No answer at phone number provided for family member Disposition Plan: home    Murray Hodgkins, MD  [facility] Direct contact: [phone] --[place] [place]  --[email or web address]; password [email or web address]  7PM-7AM contact night coverage as above 12/10/2016, 8:17 AM  LOS: 3 days   Consultants:  Gastroenterology   Procedures:  7/5 transfusion 2 units packed red blood cells  Echo Study Conclusions  - Left ventricle: The cavity size was normal. There was mild   concentric hypertrophy. Systolic function was normal. The   estimated ejection fraction was in the range of 60% to 65%. Wall   motion was normal; there were no regional wall motion   abnormalities. - Aortic valve: There was mild regurgitation. - Mitral valve: There was mild regurgitation. - Left atrium: The atrium was mildly dilated.  Antimicrobials:    Interval history/Subjective: As noted yesterday evening, ABG showed acute respiratory acidosis and the patient was transferred to the stepdown unit and maintained on BiPAP all night. Night was complicated by intermittent agitation requiring chemical sedation and persistent hypotension despite fluid boluses. The patient was started on Neo-Synephrine.  This morning the patient remains on BiPAP, groaning,  spontaneous movement is noted but he does not respond to voice.  Objective: Vitals: Afebrile, 97.2, 19, 82, 81/46  Exam:     Constitutional. Appears critically ill. Groaning. On BiPAP. Spontaneous movement is noted.  Eyes. Pupils, irises, lids appear grossly unremarkable.  ENT. Lips appear unremarkable. Exam is limited by BiPAP in place.  Neck.  Appears unremarkable.  Cardiovascular. Regular rate and rhythm. No murmur, rub or gallop. 3+ bilateral lower extremity edema as well as abdominal wall edema. Telemetry sinus rhythm.  Respiratory. Bilateral wheezes, coarse breath sounds, no rhonchi. Mild to moderate increased respiratory effort.  Abdomen is distended, edematous, soft, not clearly tender.  Skin. Bruising noted.  Musculoskeletal: Spontaneous movement noted.  Psychiatric. Cannot assess secondary to clinical condition.   I have personally reviewed the following:  Urine output per night nurse, urinary retention noted with greater than 900 mL. This is not charted.  Labs:   Complete metabolic panel in process  ABGs noted. Last night's: 7.32/42/86  Hemoglobin without significant change, 9.4. WBC within normal limits. Platelets 113.  INR 3.59  Hepatitis B surface antigen is negative; hepatitis C antibody is positive. HIV nonreactive.  Imaging studies:    Medical tests:    Test discussed with performing physician:    Decision to obtain old records:    Review and summation of old records:  08/2011 echocardiogram: LVEF 60-65 percent with normal wall motion.  Scheduled Meds: . folic acid  1 mg Oral Daily  . ipratropium-albuterol  3 mL Nebulization Q4H  . lactulose  20 g Oral TID  . multivitamin with minerals  1 tablet Oral Daily  . pantoprazole (PROTONIX) IV  40 mg Intravenous Q12H  . sodium chloride flush  3 mL Intravenous Q12H  . thiamine  100 mg Oral Daily   Or  . thiamine  100 mg Intravenous Daily   Continuous Infusions: . sodium chloride    .  sodium chloride 75 mL/hr at 12/09/16 2300  . phenylephrine (NEO-SYNEPHRINE) Adult infusion 50 mcg/min (12/10/16 7530)    Principal Problem:   Alcoholic cirrhosis of liver with ascites (HCC) Active Problems:   Thrombocytopenia (HCC)   Alcohol dependence (HCC)   Heme positive stool   Hyperbilirubinemia   Alcoholic hepatitis with ascites   Aortic atherosclerosis (HCC)   CAD (coronary artery disease)   LOS: 3 days   Critical care time 50 minutes

## 2016-12-10 NOTE — Progress Notes (Addendum)
Pharmacy Antibiotic Note  Jesse Stafford is a 61 y.o. male admitted on 12/12/2016 with possible SBP, PNA.  Pharmacy has been consulted for vancomycin and zosyn dosing.  Plan: Zosyn 3.375 gm IV q8h Vancomycin 1500 mg IV X 1 then 750 mg IV q12 hours F/u renal function, cultures and clinical course  Height: 5\' 6"  (167.6 cm) Weight: 189 lb 6 oz (85.9 kg) IBW/kg (Calculated) : 63.8  Temp (24hrs), Avg:97.5 F (36.4 C), Min:97.2 F (36.2 C), Max:97.6 F (36.4 C)   Recent Labs Lab 12/21/2016 1018 12/08/16 0550 12/09/16 0609 12/10/16 0521  WBC 8.8 11.3* 9.4 7.5  CREATININE 0.88 0.95 1.45*  --     Estimated Creatinine Clearance: 56.3 mL/min (A) (by C-G formula based on SCr of 1.45 mg/dL (H)).    No Known Allergies  Thank you for allowing pharmacy to be a part of this patient's care.  Excell Seltzer Poteet 12/10/2016 8:55 AM  Addum:  SrCr rising.  Will change vanc to 1250 mg IV q24 hours

## 2016-12-10 NOTE — Progress Notes (Signed)
Called by RN to come assess patient due to SPO2 decreasing. I found patients SPO2 to be 85% on 60% FIO2. I increased the FIO2 to 80% and gained an SPO2 of 92% from that.

## 2016-12-10 NOTE — Care Management Note (Signed)
Case Management Note  Patient Details  Name: Jesse Stafford MRN: [id number] Date of Birth: 1956-05-27  Subjective/Objective: Adm with SOB, decompensated alcoholic cirrhosis, acute alcoholic hepatitis. Patient is critically ill, prognosis poor. Palliative and spiritual consults underway.                    Action/Plan: CM following.    Expected Discharge Date:      unk            Expected Discharge Plan:     In-House Referral:     Discharge planning Services  CM Consult  Post Acute Care Choice:    Choice offered to:     DME Arranged:    DME Agency:     HH Arranged:    HH Agency:     Status of Service:  In process, will continue to follow  If discussed at Long Length of Stay Meetings, dates discussed:    Additional Comments:  Braiden Rodman, Chauncey Reading, RN 12/10/2016, 1:03 PM

## 2016-12-10 NOTE — Progress Notes (Signed)
Order in process. Attempted to notify listed contact number - no answer- voicemail message added. Will attempt another call.

## 2016-12-10 NOTE — Progress Notes (Signed)
Pt continues to have low BPs despite attempts at volume resuscitation. Suspect he is third spacing and intravascularly depleted. also has had anti-hypertensives that may still be contributing. No leukocytosis, afebrile, doubt sepsis. Discussed with PCCM, Dr. Elsworth Soho.  - Will administer low-dose neosynephrine thru peripheral IV for tonight, goal SBP 26mmHg.  - Continue albumin - Holding atenolol in AM - Will give FFP 2 units which should help with volume and help with elevated INR with plan for paracentesis 7/9.  - Hold off on insertion of arterial line given pt's agitation. Could consider this in AM.  - Will place foley as pt is having urinary retention >875cc - Continue BiPAP, BG improved and pt is tolerating this - Palliative care consult noted, though pt remains full code.   Vance Gather, MD [facility] Pager [phone] 12/10/2016, 2:21 AM

## 2016-12-10 NOTE — Consult Note (Signed)
Consultation Note Date: 12/10/2016   Patient Name: Jesse Stafford  DOB: March 04, 1956  MRN: [id number]  Age / Sex: 61 y.o., male  PCP: Raiford Simmonds., PA-C Referring Physician: Samuella Cota, MD  Reason for Consultation: Establishing goals of care and Psychosocial/spiritual support  HPI/Patient Profile: 61 y.o. male  with past medical history of Alcohol abuse, anxiety and depression, hypertension, diverticulosis, hiatal hernia, history of stroke with right-sided weakness admitted on 01/01/2017 with acute heart failure, infection, decompensated alcoholic cirrhosis.   Clinical Assessment and Goals of Care: Jesse Stafford is lying quietly in bed. He is unable to follow commands or open eyes when I request. There is no family at bedside at this time. He looks acutely ill. Call to relative listed in account, Jesse Stafford, no answer.  Call to PCP office [facility] health Department. Diane RN, states that Jesse Stafford was last seen on 5/17, and has a follow-up scheduled for 8/20. She states that they do not have record of code status in his account. She states that he lives with his brother Jesse Stafford, and gives [phone].  Call to brother Eriq Hufford. I share that Jesse Stafford is very ill, and that we are concerned about his ability to recover. Jesse Stafford states that he was not aware his brother had been doing so poorly. He states that [name] has been "drinking daily for 30 years". Jesse Stafford shares that they do not have transportation, and live too far from the hospital to walk. Jesse Stafford also shares that [name] is not married and has no children. There is another brother who lives in [place], but he has no contact information for this brother.  I returned later in the afternoon when brother Jesse Stafford and nephew Jesse Stafford had arrived. Hospitalist, Dr. Sarajane Jews is present. Jesse Stafford gives his phone number as  [phone]. He states that his father, Jesse Stafford, is on a bike trip in the [place] but he will call and let way now that Jesse Stafford is very ill. We talk about [name] acute and chronic health problems. We talk about our current treatment plan. We talk about code status. Jesse Stafford states that he spoke with his uncle occasionally, and that over the last year [name] has stated that he is very sick, he is "dying", and if he ever gets very sick "let me go". Brother Jesse Stafford states that he is unsure what his brother would and would not want. Jesse Stafford states he understands that even if [name] were to survive this hospitalization, we cannot fix his liver problems. Jesse Stafford and Jesse Stafford elect to treat the treatable, but no extraordinary measures. No CPR, no intubation. I share that we will have a follow-up tomorrow via phone if Jesse Stafford is unable to make it back to the hospital. All questions answered. Support given.  Healthcare power of attorney NEXT OF KIN - [name] lives with his brother Jesse Stafford. He has no wife or children. They have a brother, Jesse Stafford who lives nearby ([place]), Jesse Stafford does not have number at this time.  Jesse Stafford is also available for support [phone].   SUMMARY OF RECOMMENDATIONS   no CPR or intubation. Continue to treat the treatable at this point, 24 to 48 hours for outcomes.  Code Status/Advance Care Planning:  DNR  Symptom Management:   per hospitalist, no additional needs at this time.  Palliative Prophylaxis:   Aspiration, Frequent Pain Assessment and Turn Reposition  Additional Recommendations (Limitations, Scope, Preferences):  Treat the treatable but no extraordinary measures such as CPR or intubation.  Psycho-social/Spiritual:   Desire for further Chaplaincy support:no  Additional Recommendations: Caregiving  Support/Resources and Education on Hospice  Prognosis:   Unable to determine, poor prognosis, possibly in-hospital  death.  Discharge Planning: To be determined, based on outcomes. In-hospital death would not be surprising.      Primary Diagnoses: Present on Admission: . Thrombocytopenia ([place]) . Alcohol dependence ([place]) . Heme positive stool . Alcoholic cirrhosis of liver with ascites ([place]) . Hyperbilirubinemia . (Resolved) Elevated lipase   I have reviewed the medical record, interviewed the patient and family, and examined the patient. The following aspects are pertinent.  Past Medical History:  Diagnosis Date  . Alcohol abuse   . Anxiety   . Chronic sinusitis   . Depression   . Diverticulosis   . Essential hypertension    Echo in 2011-mild LVH, hyperdynamic LV function, mild to moderate AI  . Hemorrhoids   . Hiatal hernia   . History of chest pain   . History of stroke    Right-sided hemiparesis   . Thrombocytopenia ([place])   . Tubular adenoma    Social History   Social History  . Marital status: Single    Spouse name: N/A  . Number of children: 0  . Years of education: N/A   Occupational History  . Disabled    Social History Main Topics  . Smoking status: Current Every Day Smoker    Packs/day: 0.50    Years: 40.00    Types: Cigarettes  . Smokeless tobacco: Never Used  . Alcohol use 3.0 oz/week    5 Cans of beer per week     Comment: 5 cans beer daily (unable to quantify as of 12/09/16)   . Drug use: No  . Sexual activity: Not Currently   Other Topics Concern  . None   Social History Narrative  . None   Family History  Problem Relation Age of Onset  . Coronary artery disease Mother 64       CABG  . Coronary artery disease Father 44       CABG  . Colon cancer Father 48   Scheduled Meds: . dextrose  1 ampule Intravenous Once  . folic acid  1 mg Oral Daily  . hydrocortisone sod succinate (SOLU-CORTEF) inj  100 mg Intravenous Q6H  . insulin aspart  10 Units Intravenous Once  . ipratropium-albuterol  3 mL Nebulization Q4H  . multivitamin with minerals  1  tablet Oral Daily  . pantoprazole (PROTONIX) IV  40 mg Intravenous Q12H  . sodium chloride flush  3 mL Intravenous Q12H  . thiamine  100 mg Oral Daily   Or  . thiamine  100 mg Intravenous Daily   Continuous Infusions: . sodium chloride    . sodium chloride Stopped (12/10/16 0530)  . phenylephrine (NEO-SYNEPHRINE) Adult infusion 80 mcg/min (12/10/16 1241)  . piperacillin-tazobactam (ZOSYN)  IV 3.375 g (12/10/16 1056)  . sodium chloride    . [START ON 01/05/2017] vancomycin    .  vancomycin Stopped (12/10/16 1100)   PRN Meds:.sodium chloride, acetaminophen, albuterol, LORazepam **OR** LORazepam, ondansetron, sodium chloride flush Medications Prior to Admission:  Prior to Admission medications   Medication Sig Start Date End Date Taking? Authorizing Provider  albuterol (PROVENTIL HFA;VENTOLIN HFA) 108 (90 BASE) MCG/ACT inhaler Inhale 2 puffs into the lungs every 4 (four) hours as needed for shortness of breath.    Yes [provider]  amLODipine (NORVASC) 10 MG tablet Take 1 tablet (10 mg total) by mouth daily. For hypertension 08/28/12  Yes Lindell Spar I, NP  aspirin 325 MG tablet Take 1 tablet (325 mg total) by mouth daily. Blood thinner for heart health 08/28/12  Yes Lindell Spar I, NP  atenolol (TENORMIN) 50 MG tablet Take 1 tablet (50 mg total) by mouth daily. For CAD, Hypertension 08/28/12  Yes Lindell Spar I, NP  cloNIDine (CATAPRES) 0.1 MG tablet Take 1 tablet (0.1 mg total) by mouth 2 (two) times daily. For hypertension 08/28/12  Yes Nwoko, Herbert Pun I, NP  diphenhydrAMINE (BENADRYL) spray Apply topically every 4 (four) hours as needed for itching.   Yes [provider]   No Known Allergies Review of Systems  Unable to perform ROS: Acuity of condition    Physical Exam  Constitutional: No distress.  Appears acutely ill  HENT:  Head: Atraumatic.  Cardiovascular: Normal rate and regular rhythm.   Pulmonary/Chest:  BiPAP in place, work of breathing  Abdominal: He  exhibits distension. There is no guarding.  Abdomen tight, distended  Musculoskeletal: He exhibits edema.  Anasarca, 3rd spacing  Neurological:  Nonverbal at this time, does not open eyes to command  Skin: Skin is warm and dry.  Jaundiced  Nursing note and vitals reviewed.   Vital Signs: BP (!) 101/46   Pulse 86   Temp 98.2 F (36.8 C) (Oral)   Resp 20   Ht 5\' 6"  (1.676 m)   Wt 85.9 kg (189 lb 6 oz)   SpO2 97%   BMI 30.57 kg/m  Pain Assessment: CPOT   Pain Score: 0-No pain   SpO2: SpO2: 97 % O2 Device:SpO2: 97 % O2 Flow Rate: .O2 Flow Rate (L/min): 2 L/min  IO: Intake/output summary:  Intake/Output Summary (Last 24 hours) at 12/10/16 1345 Last data filed at 12/10/16 1131  Gross per 24 hour  Intake          2181.25 ml  Output                0 ml  Net          2181.25 ml    LBM: Last BM Date: 12/08/16 Baseline Weight: Weight: 86.2 kg (190 lb) Most recent weight: Weight: 85.9 kg (189 lb 6 oz)     Palliative Assessment/Data:   Flowsheet Rows     Most Recent Value  Intake Tab  Referral Department  Hospitalist  Unit at Time of Referral  ICU  Palliative Care Primary Diagnosis  Sepsis/Infectious Disease  Date Notified  12/09/16  Palliative Care Type  New Palliative care  Reason for referral  Clarify Goals of Care  Date of Admission  12/28/2016  Date first seen by Palliative Care  12/10/16  # of days Palliative referral response time  1 Day(s)  # of days IP prior to Palliative referral  2  Clinical Assessment  Palliative Performance Scale Score  20%  Pain Max last 24 hours  Not able to report  Pain Min Last 24 hours  Not able to report  Dyspnea  Max Last 24 Hours  Not able to report  Dyspnea Min Last 24 hours  Not able to report  Psychosocial & Spiritual Assessment  Palliative Care Outcomes  Patient/Family meeting held?  Yes  Who was at the meeting?  Brother Royce at [place]  Provided advance care planning, Provided psychosocial or  spiritual support, Clarified goals of care      Time In:    1005  And  1340 Time Out: 1055  And  1410 Time Total: 50 + 30 = 80 total  Greater than 50%  of this time was spent counseling and coordinating care related to the above assessment and plan.  Signed by: Drue Novel, NP   Please contact Palliative Medicine Team phone at [phone] for questions and concerns.  For individual provider: See Shea Evans

## 2016-12-10 NOTE — Progress Notes (Signed)
Pt had critical lactic acid called at 5.5. Increase from 5.2 called by lab at 2210. Midlevel provider Baltazar Najjar text paged at 2213.

## 2016-12-10 NOTE — Progress Notes (Signed)
Bladder scan reveals >964mL in bladder. Old foley to be removed and new foley to be placed.

## 2016-12-10 NOTE — Progress Notes (Signed)
Subjective: Eyes closed, moving spontaneously, groans intermittently. Does not follow commands. Appears agitated. Remains on BiPAP. Events overnight noted. Now on neosynephrine with persistent hypotension despite volume resuscitation attempts.   Objective: Vital signs in last 24 hours: Temp:  [97.4 F (36.3 C)-97.6 F (36.4 C)] 97.6 F (36.4 C) (07/09 0545) Pulse Rate:  [59-85] 75 (07/09 0744) Resp:  [11-24] 13 (07/09 0744) BP: (73-151)/(25-128) 90/37 (07/09 0751) SpO2:  [86 %-100 %] 96 % (07/09 0744) FiO2 (%):  [50 %] 50 % (07/09 0751) Weight:  [189 lb 6 oz (85.9 kg)-190 lb 11.2 oz (86.5 kg)] 189 lb 6 oz (85.9 kg) (07/09 0500) Last BM Date: 12/08/16 General:   Eyes closed, does not follow commands but moving on own, moaning, appears agitated. Mitt restraints in place  Head:  Normocephalic and atraumatic. Lungs: coarse, scattered rhonchi, diminished bases, on BiPAP Abdomen:  Bowel sounds present, moderately distended but soft, no tenderness with palpation Neurologic:  Unable to assess orientation    Intake/Output from previous day: 07/08 0701 - 07/09 0700 In: 496.8 [P.O.:50; I.V.:346.8; IV Piggyback:100] Out: 100 [Urine:100] Intake/Output this shift: Total I/O In: 250 [Blood:250] Out: -   Lab Results:  Recent Labs  12/08/16 0550 12/09/16 0609 12/10/16 0521  WBC 11.3* 9.4 7.5  HGB 10.6* 10.3* 9.4*  HCT 31.4* 30.6* 28.0*  PLT 89* 88* 113*   BMET  Recent Labs  12/20/2016 1018 12/08/16 0550 12/09/16 0609  NA 127* 133* 134*  K 4.4 4.6 5.1  CL 99* 102 105  CO2 22 26 26   GLUCOSE 120* 107* 90  BUN 27* 30* 39*  CREATININE 0.88 0.95 1.45*  CALCIUM 7.9* 8.1* 7.9*   LFT  Recent Labs  12/10/2016 1018 12/08/16 0550 12/09/16 0609  PROT 6.7 6.9 5.4*  ALBUMIN 2.0* 2.0* 1.5*  AST 87* 66* 49*  ALT 36 37 26  ALKPHOS 78 74 67  BILITOT 7.1* 11.4* 8.9*  BILIDIR  --  4.9*  --    PT/INR  Recent Labs  12/09/16 0609 12/10/16 0521  LABPROT 30.9* 36.7*  INR 2.90  3.59   Hepatitis Panel  Recent Labs  12/08/16 1235  HEPBSAG Negative  HCVAB >11.0*    Assessment: 61 year old male with history of alcohol abuse, presenting with decompensated cirrhosis likely secondary to chronic ETOH with evidence of tense ascites, anasarca, alcoholic hepatitis, new onset anemia with heme positive stools but without overt GI bleeding.   Cirrhosis: new diagnosis and suspected due to chronic ETOH use. MELD Na 37. Hep C antibody positive, Hep B surface antigen negative. Change in mental status multifactorial with likely encephalopathy, alcohol withdrawal, need for increased respiratory support with BiPAP due to acute respiratory failure in setting of COPD. End-stage liver disease. Concern for pneumonia vs pulmonary edema, with empiric antibiotics started. Doubt SBP but diagnostic-only paracentesis ordered at bedside with fluid analyses for today. Grim prognosis. INR climbing and now 3.59. No evidence of active GI bleeding. Doubt Vit K would improve significantly. Agree with palliative care consultation. Discussed briefly with palliative care.   Alcoholic hepatitis: Hep C antibody positive. Prednisolone contraindicated in setting of chronic Hep C. Unable to tolerate oral medications at this moment as well.   Ascites/anasarca: new onset in setting of ETOH cirrhosis. Diagnostic paracentesis at bedside today.   Anemia with heme positive stool: no overt GI bleeding. No endoscopic procedure indicated currently.   Patient not a candidate for liver transplantation in setting of ETOH use. Grim prognosis for patient at this time and anticipate  hospital death,. Palliative care and Spiritual care aware.      Plan: Discontinue oral lactulose. Unsafe to take oral meds Diagnostic-only paracentesis at bedside requested with fluid analysis Not a candidate for prednisolone Limited options from a GI standpoint Fully agree and appreciate Palliative and Spiritual Care input Will  follow with you    Annitta Needs, PhD, ANP-BC [facility] G[facility]     LOS: 3 days    12/10/2016, 7:56 AM

## 2016-12-11 DIAGNOSIS — J80 Acute respiratory distress syndrome: Secondary | ICD-10-CM

## 2016-12-11 DIAGNOSIS — J9601 Acute respiratory failure with hypoxia: Secondary | ICD-10-CM

## 2016-12-11 DIAGNOSIS — F10239 Alcohol dependence with withdrawal, unspecified: Secondary | ICD-10-CM

## 2016-12-11 DIAGNOSIS — J690 Pneumonitis due to inhalation of food and vomit: Secondary | ICD-10-CM

## 2016-12-11 LAB — BPAM FFP
BLOOD PRODUCT EXPIRATION DATE: 201807142359
BLOOD PRODUCT EXPIRATION DATE: 201807142359
ISSUE DATE / TIME: 201807090513
ISSUE DATE / TIME: 201807090732
Unit Type and Rh: 5100
Unit Type and Rh: 5100

## 2016-12-11 LAB — PREPARE FRESH FROZEN PLASMA
UNIT DIVISION: 0
Unit division: 0

## 2016-12-11 LAB — COMPREHENSIVE METABOLIC PANEL
ALBUMIN: 2 g/dL — AB (ref 3.5–5.0)
ALT: 44 U/L (ref 17–63)
ANION GAP: 8 (ref 5–15)
AST: 176 U/L — AB (ref 15–41)
Alkaline Phosphatase: 50 U/L (ref 38–126)
BUN: 54 mg/dL — AB (ref 6–20)
CHLORIDE: 109 mmol/L (ref 101–111)
CO2: 18 mmol/L — ABNORMAL LOW (ref 22–32)
Calcium: 7.5 mg/dL — ABNORMAL LOW (ref 8.9–10.3)
Creatinine, Ser: 2.89 mg/dL — ABNORMAL HIGH (ref 0.61–1.24)
GFR calc Af Amer: 26 mL/min — ABNORMAL LOW (ref >60)
GFR calc non Af Amer: 22 mL/min — ABNORMAL LOW (ref >60)
GLUCOSE: 40 mg/dL — AB (ref 65–99)
POTASSIUM: 6.1 mmol/L — AB (ref 3.5–5.1)
SODIUM: 135 mmol/L (ref 135–145)
TOTAL PROTEIN: 5.2 g/dL — AB (ref 6.5–8.1)
Total Bilirubin: 6.5 mg/dL — ABNORMAL HIGH (ref 0.3–1.2)

## 2016-12-11 LAB — CBC
HEMATOCRIT: 28.2 % — AB (ref 39.0–52.0)
HEMOGLOBIN: 9.2 g/dL — AB (ref 13.0–17.0)
MCH: 33.9 pg (ref 26.0–34.0)
MCHC: 32.6 g/dL (ref 30.0–36.0)
MCV: 104.1 fL — AB (ref 78.0–100.0)
Platelets: 139 10*3/uL — ABNORMAL LOW (ref 150–400)
RBC: 2.71 MIL/uL — AB (ref 4.22–5.81)
RDW: 18.1 % — ABNORMAL HIGH (ref 11.5–15.5)
WBC: 8.1 10*3/uL (ref 4.0–10.5)

## 2016-12-11 LAB — ACID FAST SMEAR (AFB, MYCOBACTERIA): Acid Fast Smear: NEGATIVE

## 2016-12-11 LAB — PROTIME-INR
INR: 3.5
Prothrombin Time: 35.9 seconds — ABNORMAL HIGH (ref 11.4–15.2)

## 2016-12-11 LAB — HCV RNA QUANT
HCV Quantitative Log: 3.223 log10 IU/mL (ref >1.70)
HCV Quantitative: 1670 IU/mL (ref >50)

## 2016-12-11 LAB — POTASSIUM
POTASSIUM: 5.8 mmol/L — AB (ref 3.5–5.1)
POTASSIUM: 5.9 mmol/L — AB (ref 3.5–5.1)

## 2016-12-11 LAB — ACID FAST SMEAR (AFB)

## 2016-12-11 LAB — AMMONIA: AMMONIA: 122 umol/L — AB (ref 9–35)

## 2016-12-11 MED ORDER — VANCOMYCIN HCL IN DEXTROSE 1-5 GM/200ML-% IV SOLN: 1000.0000 mg | INTRAVENOUS | Status: DC

## 2016-12-11 MED ORDER — PHENYLEPHRINE HCL 10 MG/ML IJ SOLN
INTRAMUSCULAR | Status: AC
  Filled 2016-12-11: qty 4

## 2016-12-11 MED ORDER — DEXTROSE 50 % IV SOLN
1.0000 | Freq: Once | INTRAVENOUS | Status: AC
  Administered 2016-12-11: 50 mL via INTRAVENOUS
  Filled 2016-12-11: qty 50

## 2016-12-15 LAB — CULTURE, BLOOD (ROUTINE X 2)
CULTURE: NO GROWTH
Culture: NO GROWTH
Special Requests: ADEQUATE
Special Requests: ADEQUATE

## 2016-12-15 LAB — CULTURE, BODY FLUID W GRAM STAIN -BOTTLE

## 2016-12-15 LAB — CULTURE, BODY FLUID-BOTTLE: CULTURE: NO GROWTH

## 2017-01-02 NOTE — Progress Notes (Signed)
Pt has critical glucose of 40. Lorin Mercy, MD notified via text page at [phone]

## 2017-01-02 NOTE — Progress Notes (Signed)
Entered room this morning to assess and treat patient to find him agonal breathing and SPO2 at 74. I have witnessed his SPO2 steadily drop to 69 while being with him. I increased the IPAP on the Bipap maching to try to increase his VT. Hes now getting a VT of low 200s. I increased the EPAP as well to try to increase the oxygenation at this time as well. There has been no change. The patient is agonal breathing at this time. RN and Cardiology witnessed this as well.

## 2017-01-02 NOTE — Death Summary Note (Signed)
Death Summary  Jesse Stafford [id number]:[id number] DOB: 12-05-1955 DOA: December 14, 2016  PCP: Royce Macadamia D., PA-C    Admit date: 12-14-16 Date of Death: 2016-12-18  Final Diagnoses:  1. ARDS 2. Suspected aspiration pneumonitis 3. Acute kidney injury 4. Decompensated alcoholic cirrhosis 5. Alcoholic hepatitis 6. Alcohol withdrawal 7. Aortic atherosclerosis  History of present illness:  61 year old man PMH ongoing alcohol use, hypertension presented with 6 week history of shortness of breath, dyspnea on exertion and orthopnea. Admitted for decompensated alcoholic cirrhosis, acute alcoholic hepatitis.  Hospital Course:  Patient was admitted and seen by gastroenterology in consultation for acute alcoholic hepatitis and decompensated alcoholic cirrhosis with associated massive volume overload. He was treated with diuretics and steroid therapy was considered for steroids, pending infectious hepatitis results. He developed alcohol withdrawal and then later profound hypoxic respiratory failure and was transferred to the stepdown unit where he continued to decline, developed ARDS, acute kidney injury, refractory hypotension and hypoxia, lactic acidosis, likely secondary to aspiration pneumonitis. Despite broad-spectrum antibiotics, vasopressor, volume resuscitation and BiPAP he failed to improve. After further discussion with the family he was made full comfort care and past 12/19/22 at 10:05 AM.   Time:  50 minutes  Signed:  Murray Hodgkins, MD  [facility] 2016/12/18, 10:33 AM

## 2017-01-02 NOTE — Progress Notes (Signed)
Subjective: Unresponsive. Events overnight noted. Hypotensive, on BiPAP with O2 sats in 70s. RN and RT at bedside.   Objective: Vital signs in last 24 hours: Temp:  [96.7 F (35.9 C)-98.2 F (36.8 C)] 98 F (36.7 C) (07/10 0400) Pulse Rate:  [74-99] 74 (07/10 0804) Resp:  [14-26] 17 (07/10 0630) BP: (57-104)/(23-75) 57/27 (07/10 0804) SpO2:  [69 %-100 %] 69 % (07/10 0804) FiO2 (%):  [50 %-100 %] 100 % (07/10 0756) Weight:  [189 lb 6 oz (85.9 kg)] 189 lb 6 oz (85.9 kg) (07/10 0500) Last BM Date: 12/08/16 General:   Unresponsive. Pupils dilated and non-reactive.  Abdomen:  Bowel sounds hypoactive, moderately tense Extremities:  With anasarca, weeping of extremities  Neurologic:  Unresponsive   Intake/Output from previous day: 07/09 0701 - 07/10 0700 In: 4579.1 [I.V.:3531.1; Blood:898; IV Piggyback:150] Out: -  Intake/Output this shift: No intake/output data recorded.  Lab Results:  Recent Labs  12/09/16 0609 12/10/16 0521 01-Jan-2017 0124  WBC 9.4 7.5 8.1  HGB 10.3* 9.4* 9.2*  HCT 30.6* 28.0* 28.2*  PLT 88* 113* 139*   BMET  Recent Labs  12/09/16 0609 12/10/16 1100  12/10/16 2046 01-Jan-2017 0124 2017/01/01 0442  NA 134* 133*  --   --   --  135  K 5.1 6.3*  < > 5.8* 5.9* 6.1*  CL 105 105  --   --   --  109  CO2 26 20*  --   --   --  18*  GLUCOSE 90 83  --   --   --  40*  BUN 39* 49*  --   --   --  54*  CREATININE 1.45* 2.30*  --   --   --  2.89*  CALCIUM 7.9* 8.0*  --   --   --  7.5*  < > = values in this interval not displayed. LFT  Recent Labs  12/09/16 0609 12/10/16 1100 January 01, 2017 0442  PROT 5.4* 6.0* 5.2*  ALBUMIN 1.5* 2.4* 2.0*  AST 49* 94* 176*  ALT 26 31 44  ALKPHOS 67 57 50  BILITOT 8.9* 8.1* 6.5*   PT/INR  Recent Labs  12/10/16 0521 2017/01/01 0124  LABPROT 36.7* 35.9*  INR 3.59 3.50   Hepatitis Panel  Recent Labs  12/08/16 1235  HEPBSAG Negative  HCVAB >11.0*    Studies/Results: US Paracentesis  Result Date:  12/10/2016 INDICATION: Alcoholic hepatitis, ascites EXAM: ULTRASOUND GUIDED DIAGNOSTIC PARACENTESIS MEDICATIONS: None. COMPLICATIONS: None immediate. PROCEDURE: Patient unable to provide consent. Procedure performed due to medical necessity as deemed by referring provider. Time out protocol followed. Adequate collection of ascites localized by ultrasound in RIGHT lower quadrant. Skin prepped and draped in usual sterile fashion. Skin and soft tissues anesthetized with 10 mL of 1% lidocaine. 5 [place] Yueh catheter placed into peritoneal cavity. 120 mL of yellow ascitic fluid aspirated by vacuum bottle suction. Procedure tolerated well by patient without immediate complication. FINDINGS: A total of approximately 120 mL of ascitic fluid was removed. Fluid was sent to the laboratory as requested by the clinical team. IMPRESSION: Successful ultrasound-guided diagnostic paracentesis yielding 120 mL of peritoneal fluid. Electronically Signed   By: Lavonia Dana M.D.   On: 12/10/2016 12:45   Dg Chest Port 1 View  Result Date: 12/10/2016 CLINICAL DATA:  Acute hypoxemic respiratory failure EXAM: PORTABLE CHEST 1 VIEW COMPARISON:  12/19/2016 FINDINGS: Diffuse airspace disease throughout the lungs could reflect edema or infection. Heart is normal size. No effusions or acute bony abnormality.  IMPRESSION: Diffuse severe bilateral airspace disease could reflect edema or infection. Favor infection given the normal heart size. Electronically Signed   By: Rolm Baptise M.D.   On: 12/10/2016 08:57    Assessment/Plan: 61 year old male with cirrhosis, ETOH hepatitis, decompensated end-stage liver disease with multi-organ failure. Anticipate a hospital death today. As of note, no evidence for SBP. Unfortunate scenario and actively dying. Will sign off due to patient's status. Appreciate multi-disciplinary involvement in patient's care throughout hospitalization and now at end-of-life.   Annitta Needs, PhD, ANP-BC [facility]  Gastroenterology          LOS: 4 days    12/12/2016, 10:00 AM

## 2017-01-02 NOTE — Progress Notes (Signed)
eLink Physician-Brief Progress Note Patient Name: Jesse Stafford DOB: February 07, 1956 MRN: [id number]   Date of Service  December 15, 2016  HPI/Events of Note  Hypotensive on neo gtt , presumed sepsis, nml LV fn, nml cortisol  eICU Interventions  Goals of care discussion noted Cap neo @ 200 mcg     Intervention Category Major Interventions: Hypotension - evaluation and management  Geonna Lockyer V. Dec 15, 2016, 3:10 AM

## 2017-01-02 NOTE — Progress Notes (Signed)
Patient left with [place] at 1338 today

## 2017-01-02 NOTE — Progress Notes (Signed)
[facility] notified of patient's death, spoke with Dance movement psychotherapist. Referral number [id number]

## 2017-01-02 NOTE — Progress Notes (Signed)
Pharmacy Antibiotic Note  Jesse Stafford is a 61 y.o. male admitted on 12/18/2016 with possible SBP, PNA.  Pharmacy has been consulted for vancomycin and zosyn dosing.  Worse renal function this AM.  Initial dose of IV Vancomycin delayed yesterday due to IV access.  Plan: Zosyn 3.375 gm IV q8h Reduce Vancomycin to 1gm IV every 24 hours. F/u renal function, cultures and clinical course  Height: 5\' 6"  (167.6 cm) Weight: 189 lb 6 oz (85.9 kg) IBW/kg (Calculated) : 63.8  Temp (24hrs), Avg:97.6 F (36.4 C), Min:96.7 F (35.9 C), Max:98.2 F (36.8 C)   Recent Labs Lab 12/09/2016 1018 12/08/16 0550 12/09/16 0609 12/10/16 0521 12/10/16 1000 12/10/16 1100 12/10/16 1500 12/10/16 2116 2016/12/13 0124 12-13-16 0442  WBC 8.8 11.3* 9.4 7.5  --   --   --   --  8.1  --   CREATININE 0.88 0.95 1.45*  --   --  2.30*  --   --   --  2.89*  LATICACIDVEN  --   --   --   --  4.1*  --  5.2* 5.5*  --   --     Estimated Creatinine Clearance: 28.3 mL/min (A) (by C-G formula based on SCr of 2.89 mg/dL (H)).    No Known Allergies   Antimicrobials this admission:  Zosyn 7/9 >>  vanc 7/9 >>   Dose adjustments this admission:  Vancomycin reduced due to rise in Cr  Microbiology results:  7/9 BCx: pending 7/9 Peritoneal Fluid: pending  7/9 AFB: pending  7/8 MRSA PCR: (-)  Thank you for allowing pharmacy to be a part of this patient's care.  Pricilla Larsson 2016/12/13 9:17 AM

## 2017-01-02 NOTE — Progress Notes (Signed)
1005 Patient expired, MD and this writer present at time of death. Family present at facility. Bed control notified.

## 2017-01-02 NOTE — Progress Notes (Signed)
PROGRESS NOTE  Jesse Stafford [id number]:[id number] DOB: Jul 10, 1955 DOA: 12/06/2016 PCP: Royce Macadamia D., PA-C  Brief Narrative: 61 year old man PMH ongoing alcohol use, hypertension presented with 6 week history of shortness of breath, dyspnea on exertion and orthopnea. Admitted for decompensated alcoholic cirrhosis, acute alcoholic hepatitis.  Assessment/Plan ARDS,  refractory hypotension, refractory acute hypoxic respiratory failure, acute kidney injury, secondary to suspected aspiration pneumonitis.  Decompensated alcoholic cirrhosis with associated portal hypertension, gastric varices, hyponatremia, thrombocytopenia, large volume ascites, coagulopathy, hepatic encephalopathy  Alcoholic hepatitis  Alcohol dependence, alcohol withdrawal  Aortic atherosclerosis  Patient's pupils fixed and dilated, agonal breathing, profound hypoxia and hypotension despite BiPAP and vasopressors. Discussed with two brothers at [place], recommended full comfort care which they agreed with. BiPAP was withdrawn and the patient passed peacefully, time of death 1005.    Murray Hodgkins, MD  [facility] Direct contact: [phone] --Via amion app OR  --[email or web address]; password [email or web address]  7PM-7AM contact night coverage as above 01/02/17, 8:54 AM  LOS: 4 days   Consultants:  Gastroenterology   Procedures:  7/5 transfusion 2 units packed red blood cells  Echo Study Conclusions  - Left ventricle: The cavity size was normal. There was mild   concentric hypertrophy. Systolic function was normal. The   estimated ejection fraction was in the range of 60% to 65%. Wall   motion was normal; there were no regional wall motion   abnormalities. - Aortic valve: There was mild regurgitation. - Mitral valve: There was mild regurgitation. - Left atrium: The atrium was mildly dilated.  Antimicrobials:    Interval history/Subjective: Hypotensive overnight, obtunded, pupils fixed and  dilated per nursing. Hypoglycemic. Profoundly hypoxic on BiPAP despite aggressive respiratory therapy. Intermittent bleeding from there is. Skin weeping.  Patient unresponsive. Objective: Vitals: 98.0, 17, 18, 64/35, a 9% on 100% FiO2 BiPAP.  Exam:     Constitutional. Appears to be benign. Agonal breathing. Unresponsive.  Eyes: Pupils fixed, dilated. Lids appear unremarkable.  ENT: There is some dried blood around the lips.  Cardiovascular: Regular rate and rhythm. No murmur, rub or gallop. 3+ bilateral lower extremity edema all the way to abdomen.  Respiratory: Clear breath sounds, very poor air movement. No frank wheezes, rales or rhonchi. Agonal breathing.   Abdomen: soft, distended  Skin: Scattered ecchymosis. Nontender. No nodules noted.  Musculoskeletal: Cannot assess secondary to condition.  Psychiatric: Cannot assess secondary to condition.  Neurologic: Cannot assess   I have personally reviewed the following:    Labs:   Potassium 6.1, CO2 18, BUN up to 54, creatinine of 2.89, bilirubin trending down 6.5.  Serum lactic acid without improvement, last check 5.5  Hemoglobin stable 9.2, platelets improved, 139.  INR 3.5.  Imaging studies:    Medical tests:    Test discussed with performing physician:    Decision to obtain old records:    Review and summation of old records:  08/2011 echocardiogram: LVEF 60-65 percent with normal wall motion.  Scheduled Meds: . hydrocortisone sod succinate (SOLU-CORTEF) inj  100 mg Intravenous Q6H  . ipratropium-albuterol  3 mL Nebulization Q4H  . pantoprazole (PROTONIX) IV  40 mg Intravenous Q12H  . sodium chloride flush  3 mL Intravenous Q12H  . thiamine  100 mg Oral Daily   Or  . thiamine  100 mg Intravenous Daily   Continuous Infusions: . sodium chloride    . sodium chloride 150 mL/hr at January 02, 2017 0332  . phenylephrine (NEO-SYNEPHRINE) Adult infusion 200 mcg/min (01-02-2017 0748)  .  piperacillin-tazobactam (  ZOSYN)  IV 3.375 g (12/21/16 0115)  . vancomycin      Principal Problem:   Alcoholic cirrhosis of liver with ascites (HCC) Active Problems:   Thrombocytopenia (HCC)   Alcohol dependence (HCC)   Heme positive stool   Hyperbilirubinemia   Alcoholic hepatitis with ascites   Aortic atherosclerosis (HCC)   CAD (coronary artery disease)   Ascites due to alcoholic cirrhosis ([place])   Palliative care encounter   Goals of care, counseling/discussion   DNR (do not resuscitate) discussion   AKI (acute kidney injury) ([place])   Acute on chronic respiratory failure with hypoxia ([place])   LOS: 4 days

## 2017-01-02 DEATH — deceased

## 2017-01-23 LAB — ACID FAST CULTURE WITH REFLEXED SENSITIVITIES

## 2017-01-23 LAB — ACID FAST CULTURE WITH REFLEXED SENSITIVITIES (MYCOBACTERIA): Acid Fast Culture: NEGATIVE

## 2019-05-22 IMAGING — CT CT CHEST W/ CM
2 of 5 series · 13 of 36 positions shown, 16 images · IV contrast (Isovue)
Comparison: No priors.

CLINICAL DATA: 60-year-old male with history of shortness of
breath. Diffuse swelling. Distended abdomen. Jaundice. Elevated
LFTs. Ascites.

EXAM:
CT CHEST, ABDOMEN, AND PELVIS WITH CONTRAST
TECHNIQUE: Multidetector CT imaging of the chest, abdomen and pelvis was
performed following the standard protocol during bolus
administration of intravenous contrast.
CONTRAST:  100mL GQEWVQ-5VV IOPAMIDOL (GQEWVQ-5VV) INJECTION 61%

[Series 3: cap with · axial · 0.79mm/px · z∈[+836,+1376]mm · 10 of 132 slices shown, 13 images]
[im 12/132  mediastinal]
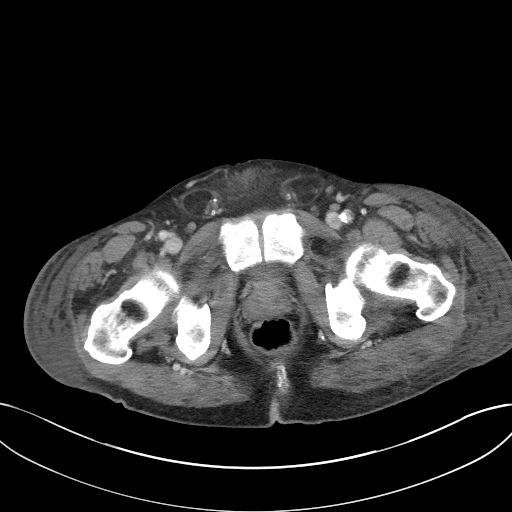
[im 12/132  lung]
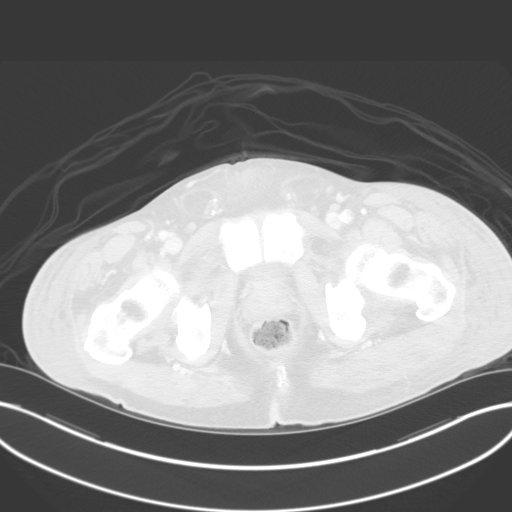
[im 24/132  lung]
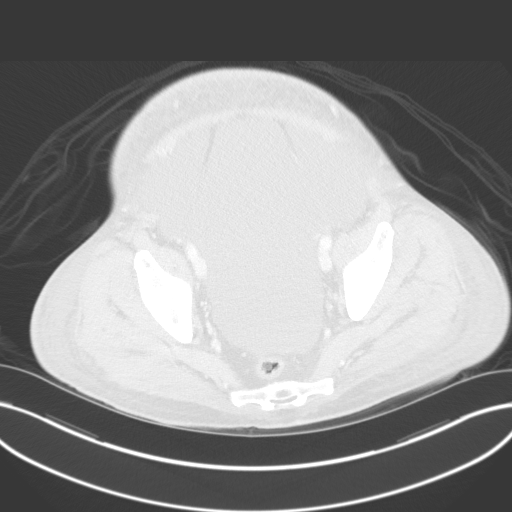
[im 36/132  lung]
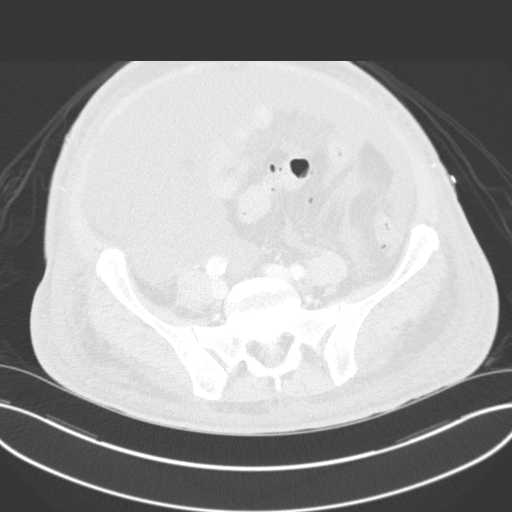
[im 48/132  lung]
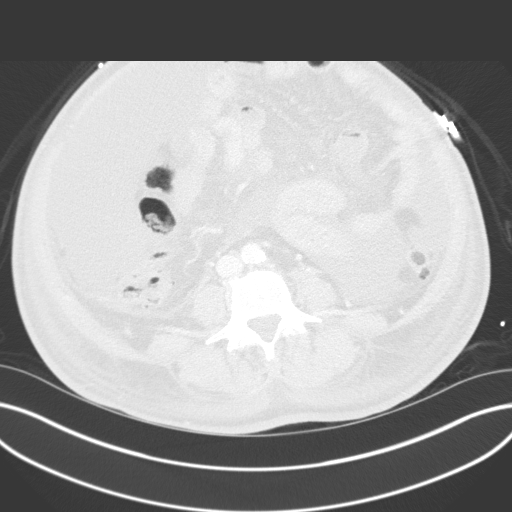
[im 60/132  mediastinal]
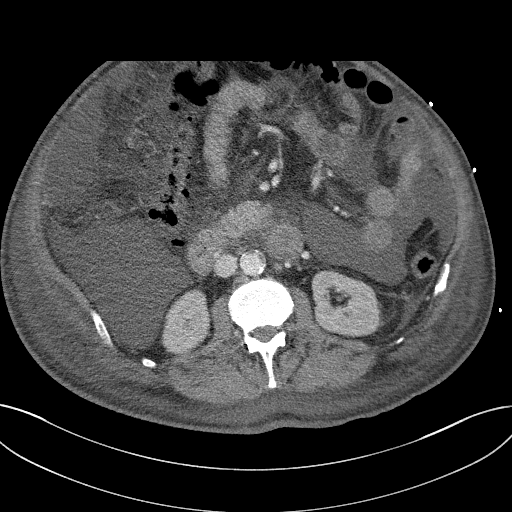
[im 60/132  lung]
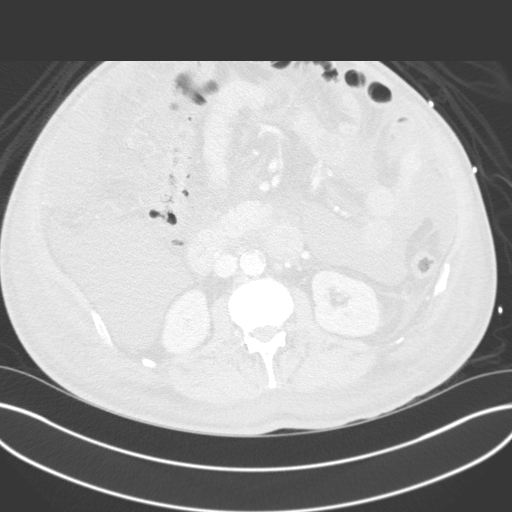
[im 72/132  lung]
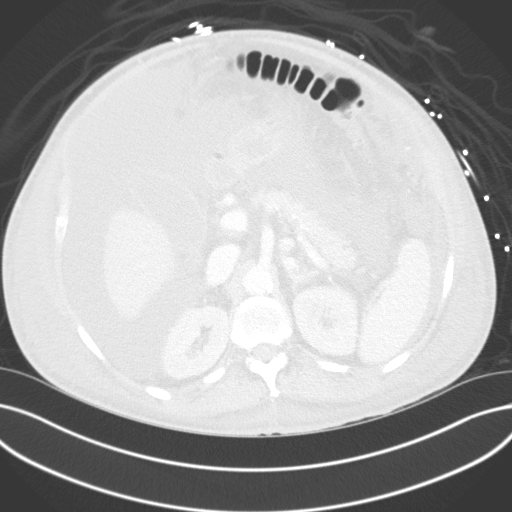
[im 84/132  lung]
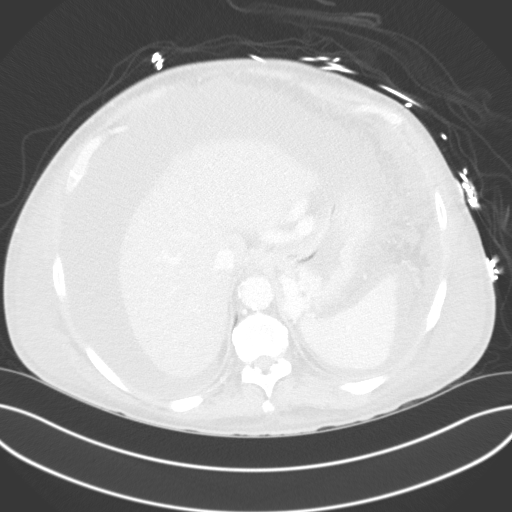
[im 96/132  lung]
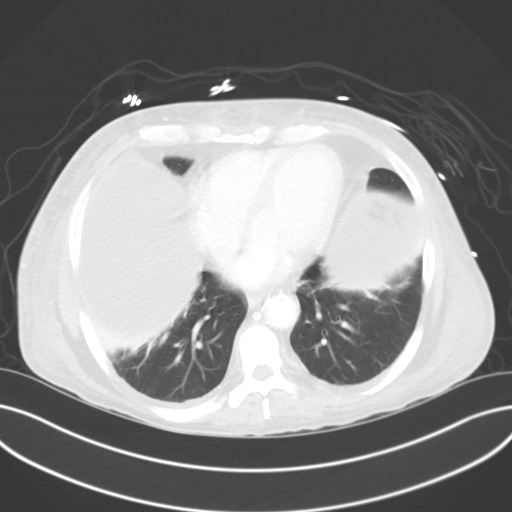
[im 108/132  mediastinal]
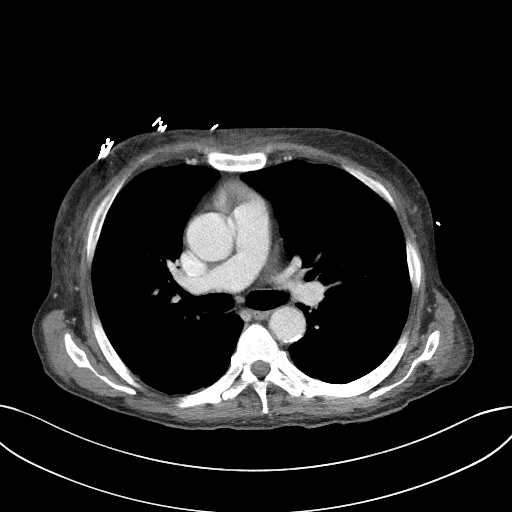
[im 108/132  lung]
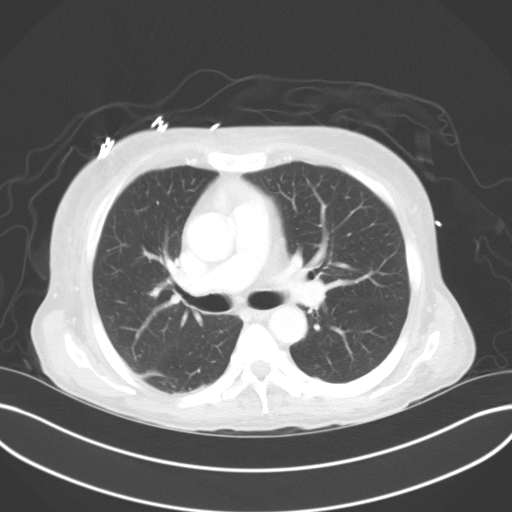
[im 120/132  lung]
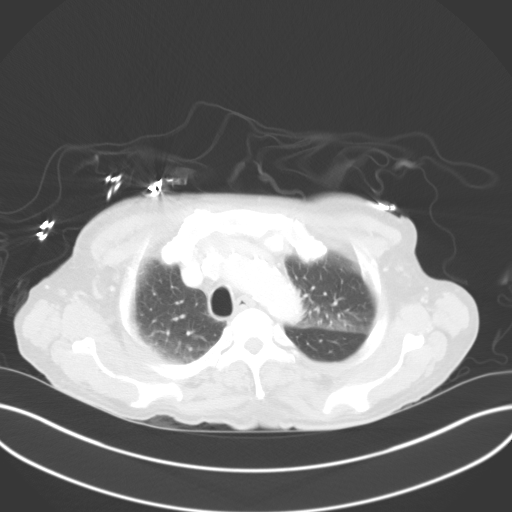

[Series 5: coronals · coronal · 0.92mm/px · 3 of 175 slices shown]
[im 35/175  lung]
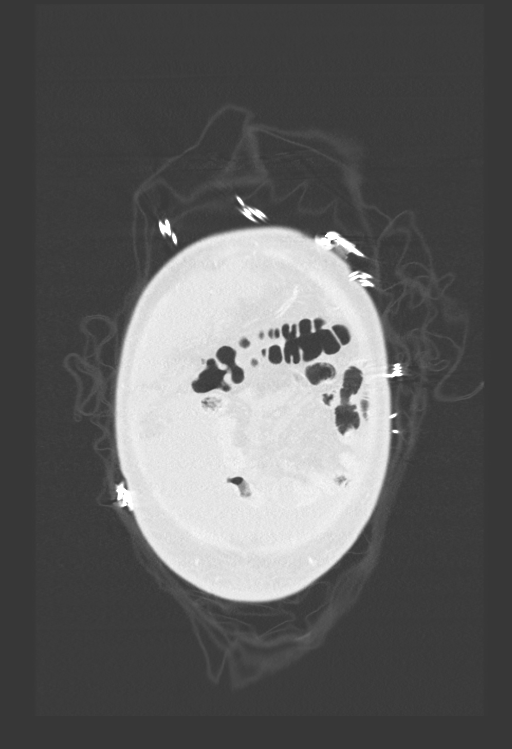
[im 70/175  lung]
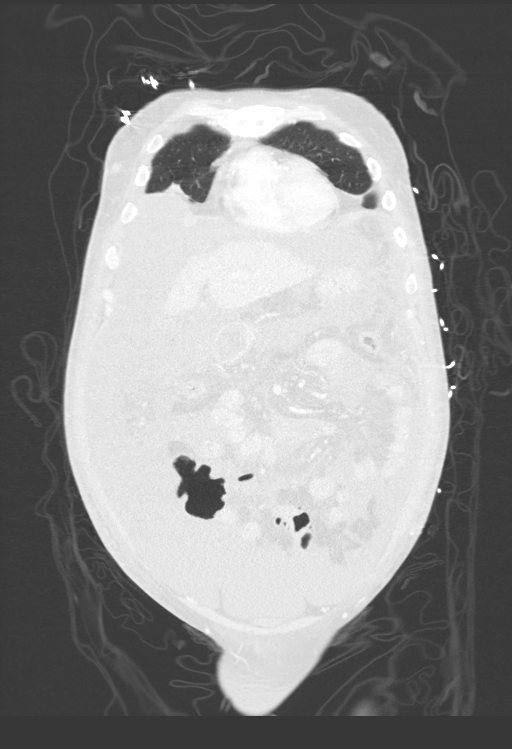
[im 105/175  lung]
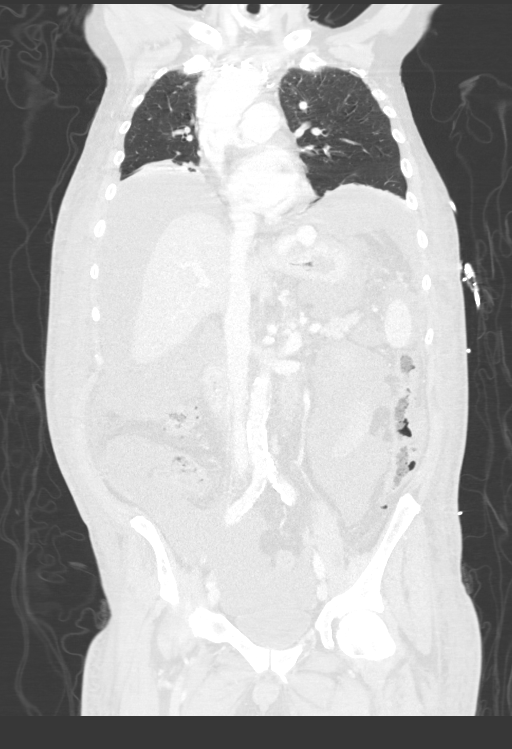

[13 of 36 positions shown; findings below may reference images not displayed]

FINDINGS: CT CHEST FINDINGS

Cardiovascular: Heart size is normal. There is no significant
pericardial fluid, thickening or pericardial calcification. There is
aortic atherosclerosis, as well as atherosclerosis of the great
vessels of the mediastinum and the coronary arteries, including
calcified atherosclerotic plaque in the left main, left anterior
descending and right coronary arteries. Calcifications of the aortic
valve.

Mediastinum/Nodes: No pathologically enlarged mediastinal or hilar
lymph nodes. Esophagus is unremarkable in appearance. No axillary
lymphadenopathy.

Lungs/Pleura: No suspicious appearing pulmonary nodules or masses.
No acute consolidative airspace disease. No pleural effusions. Areas
of bibasilar subsegmental atelectasis.

Musculoskeletal: There are no aggressive appearing lytic or blastic
lesions noted in the visualized portions of the skeleton.

CT ABDOMEN PELVIS FINDINGS

Hepatobiliary: Liver has a shrunken appearance and nodular contour,
compatible with underlying cirrhosis. No discrete cystic or solid
hepatic lesions are identified. No intra or extrahepatic biliary
ductal dilatation. Gallbladder appears moderately distended. No
definite calcified gallstones are noted.

Pancreas: No pancreatic mass. No pancreatic ductal dilatation. No
well-defined peripancreatic fluid collections.

Spleen: Unremarkable.

Adrenals/Urinary Tract: Bilateral kidneys and bilateral adrenal
glands are normal in appearance. No hydroureteronephrosis. Urinary
bladder is normal in appearance.

Stomach/Bowel: The appearance of the stomach is normal. There is no
pathologic dilatation of small bowel or colon. Numerous colonic
diverticulae are noted. Unfortunately, secondary to the large amount
of ascites, accurate assessment for surrounding inflammatory changes
is not possible on today's examination. Normal appendix.

Vascular/Lymphatic: Aortic atherosclerosis, without evidence of
aneurysm or dissection in the abdominal or pelvic vasculature.
Numerous portosystemic collateral pathways are noted, most notable
for large gastric varices which communicate to the left renal vein.
The largest of these gastric varices is adjacent to the cardia of
the stomach measuring up to 2.3 cm in diameter. No lymphadenopathy
noted in the abdomen or pelvis.

Reproductive: Prostate gland seminal vesicles are unremarkable in
appearance.

Other: Large volume of ascites.  No pneumoperitoneum.

Musculoskeletal: Diffuse body wall edema. There are no aggressive
appearing lytic or blastic lesions noted in the visualized portions
of the skeleton.
IMPRESSION: 1. Stigmata of cirrhosis and portal hypertension with large
portosystemic collateral vessels, most notable for large gastric
varices measuring up to 2.3 cm in diameter.
2. Large volume of ascites.  Diffuse body wall edema.
3. No acute findings in the thorax.
4. Colonic diverticulosis.
5. Aortic atherosclerosis, in addition to left main and 2 vessel
coronary artery disease. Please note that although the presence of
coronary artery calcium documents the presence of coronary artery
disease, the severity of this disease and any potential stenosis
cannot be assessed on this non-gated CT examination. Assessment for
potential risk factor modification, dietary therapy or pharmacologic
therapy may be warranted, if clinically indicated.
6. There are calcifications of the aortic valve. Echocardiographic
correlation for evaluation of potential valvular dysfunction may be
warranted if clinically indicated.
7. Additional incidental findings, as above.

Aortic Atherosclerosis ([id number].M).

## 2019-05-25 IMAGING — US US PARACENTESIS
1 series · 9 of 9 positions shown · non-contrast
Comparison: none

INDICATION: Alcoholic hepatitis, ascites

[Series 1: us paracentesis · 0.25mm/px · 9 of 9 slices shown]
[im 1/9]
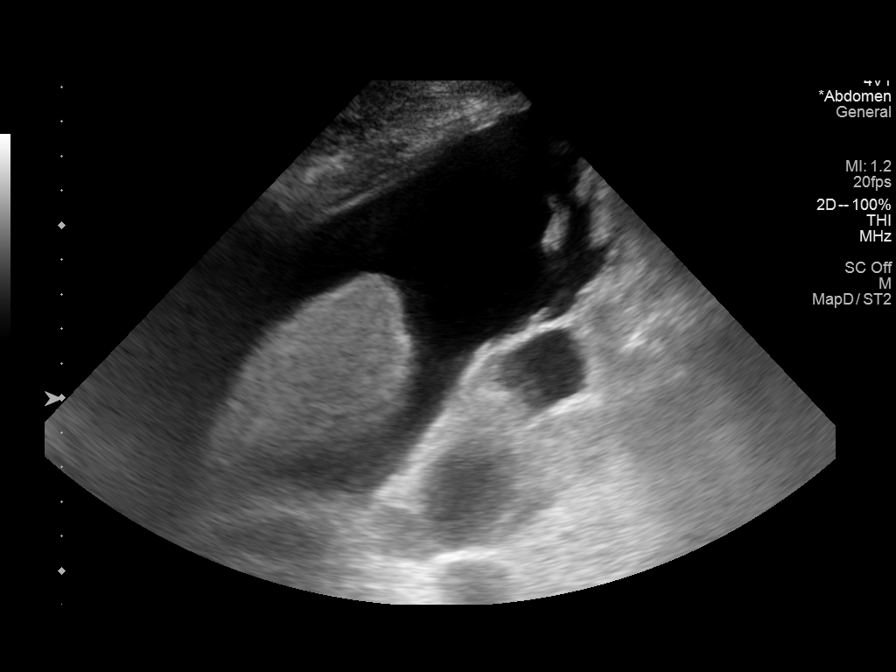
[im 2/9]
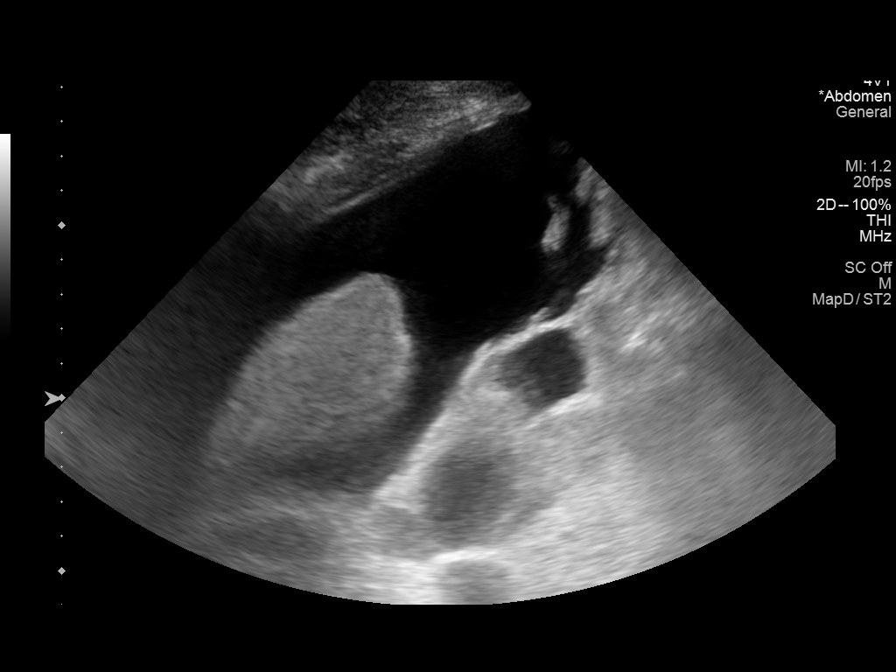
[im 3/9]
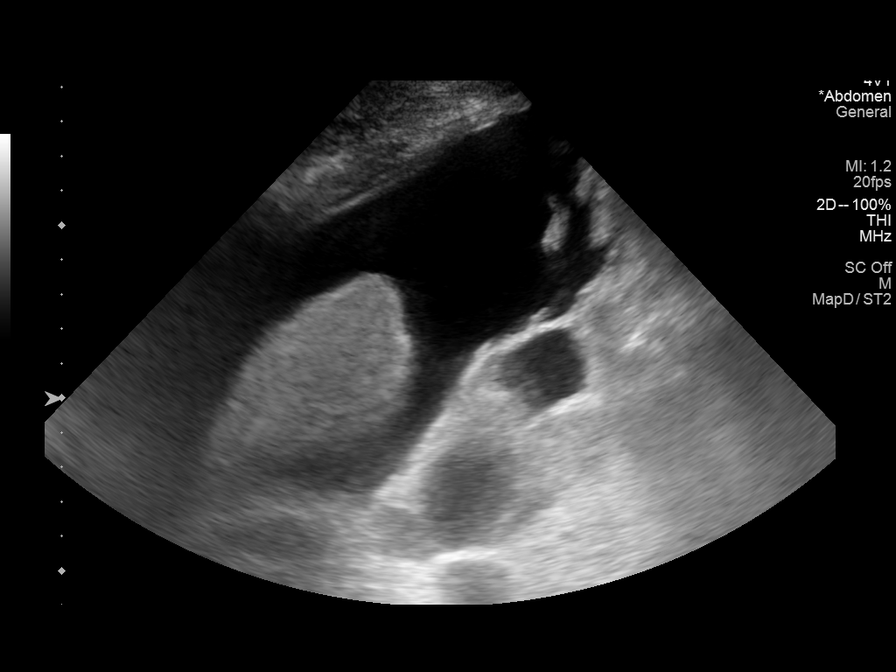
[im 4/9]
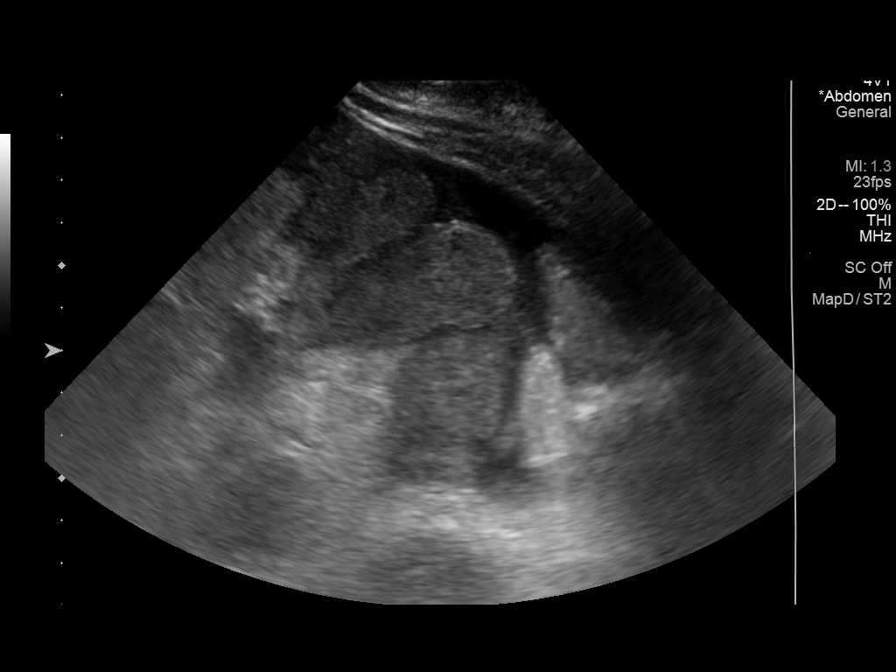
[im 5/9]
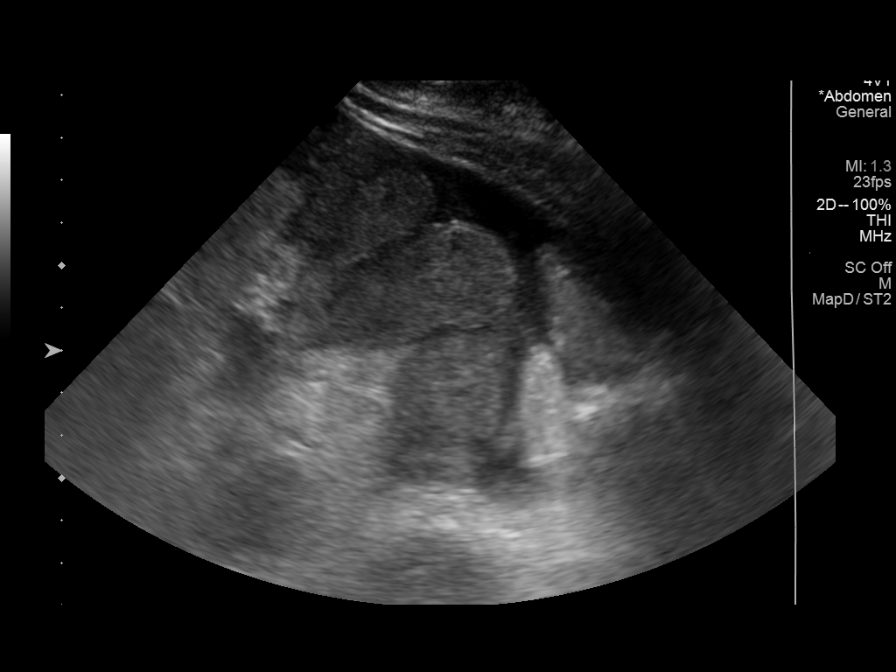
[im 6/9]
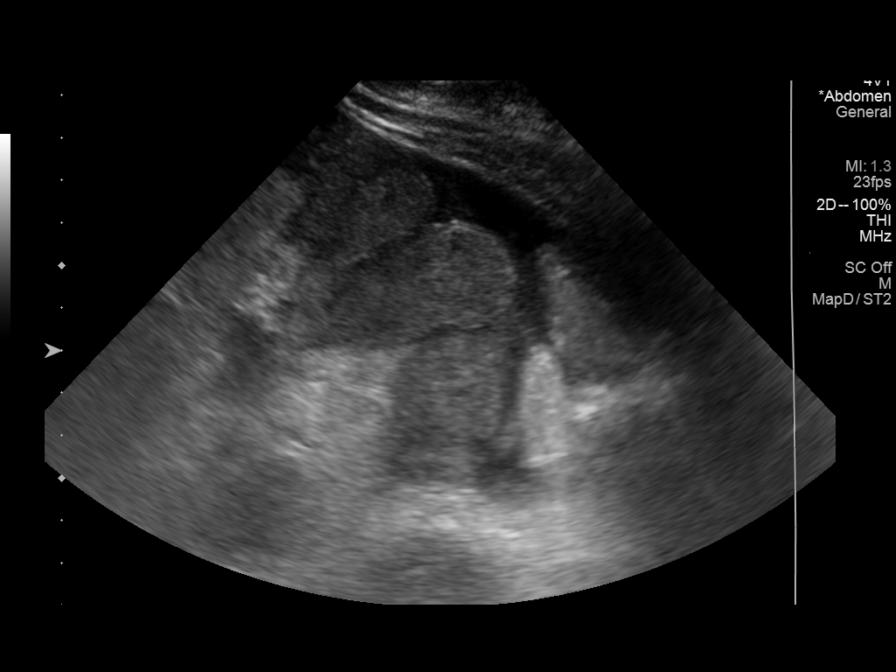
[im 7/9]
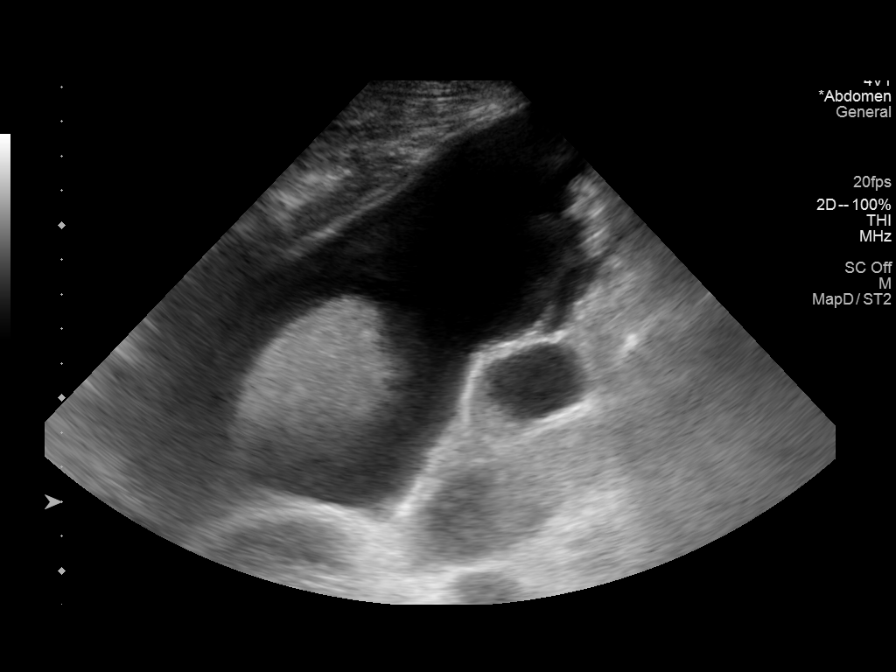
[im 8/9]
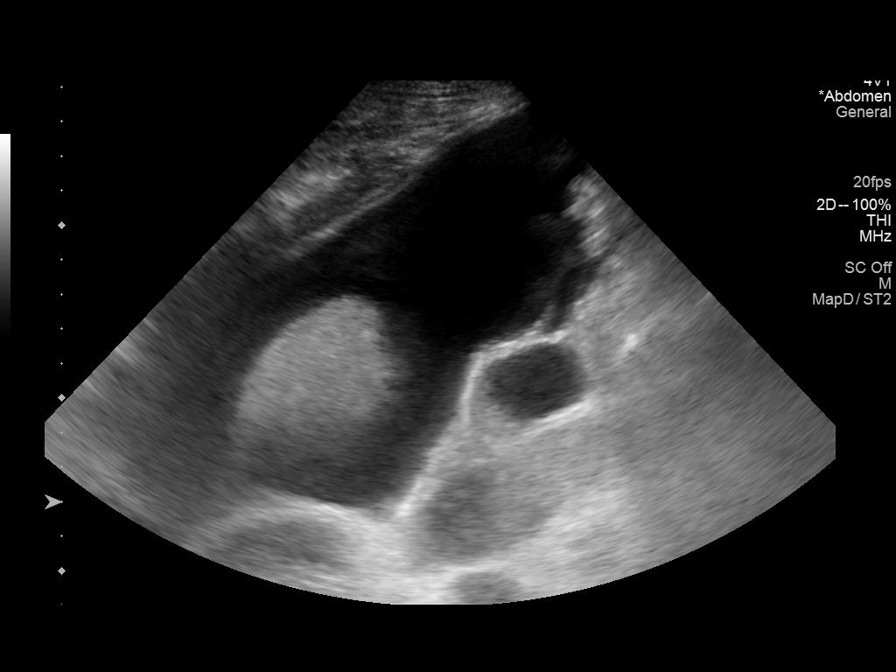
[im 9/9]
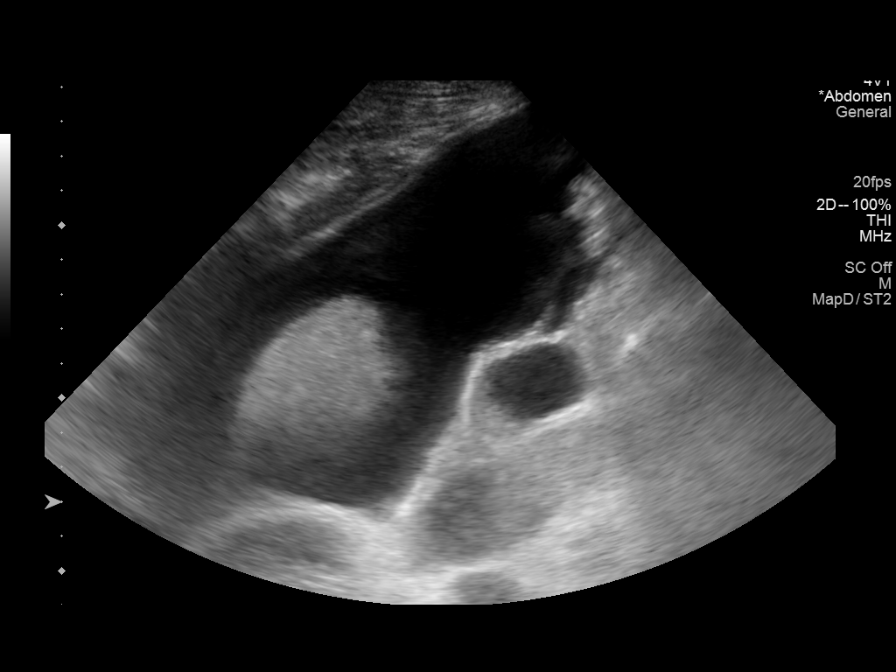

[9 of 9 positions shown; findings below may reference images not displayed]

EXAM:
ULTRASOUND GUIDED DIAGNOSTIC PARACENTESIS

MEDICATIONS:
None.

COMPLICATIONS:
None immediate.

PROCEDURE:
Patient unable to provide consent.

Procedure performed due to medical necessity as deemed by referring
provider.

Time out protocol followed.

Adequate collection of ascites localized by ultrasound in RIGHT
lower quadrant.

Skin prepped and draped in usual sterile fashion.

Skin and soft tissues anesthetized with 10 mL of 1% lidocaine.

5 [place] Yueh catheter placed into peritoneal cavity.

120 mL of yellow ascitic fluid aspirated by vacuum bottle suction.

Procedure tolerated well by patient without immediate complication.
FINDINGS: A total of approximately 120 mL of ascitic fluid was removed.

Fluid was sent to the laboratory as requested by the clinical team.
IMPRESSION: Successful ultrasound-guided diagnostic paracentesis yielding 120 mL
of peritoneal fluid.
